# Patient Record
Sex: Female | Born: 1940 | Race: White | Hispanic: No | Marital: Single | Smoking: Former smoker
Health system: Southern US, Community
[De-identification: ages and names within clinical notes are randomized; demographics above are authoritative.]

## PROBLEM LIST (undated history)

## (undated) DIAGNOSIS — Z9889 Other specified postprocedural states: Secondary | ICD-10-CM

## (undated) DIAGNOSIS — D3A090 Benign carcinoid tumor of the bronchus and lung: Secondary | ICD-10-CM

## (undated) DIAGNOSIS — G4700 Insomnia, unspecified: Secondary | ICD-10-CM

## (undated) DIAGNOSIS — I10 Essential (primary) hypertension: Secondary | ICD-10-CM

## (undated) DIAGNOSIS — F32A Depression, unspecified: Secondary | ICD-10-CM

## (undated) DIAGNOSIS — R918 Other nonspecific abnormal finding of lung field: Secondary | ICD-10-CM

## (undated) DIAGNOSIS — M109 Gout, unspecified: Secondary | ICD-10-CM

## (undated) DIAGNOSIS — R112 Nausea with vomiting, unspecified: Secondary | ICD-10-CM

## (undated) DIAGNOSIS — J449 Chronic obstructive pulmonary disease, unspecified: Secondary | ICD-10-CM

## (undated) DIAGNOSIS — E1149 Type 2 diabetes mellitus with other diabetic neurological complication: Secondary | ICD-10-CM

## (undated) DIAGNOSIS — Z87442 Personal history of urinary calculi: Secondary | ICD-10-CM

## (undated) DIAGNOSIS — R0602 Shortness of breath: Secondary | ICD-10-CM

## (undated) DIAGNOSIS — F329 Major depressive disorder, single episode, unspecified: Secondary | ICD-10-CM

## (undated) DIAGNOSIS — E785 Hyperlipidemia, unspecified: Secondary | ICD-10-CM

## (undated) DIAGNOSIS — M1990 Unspecified osteoarthritis, unspecified site: Secondary | ICD-10-CM

## (undated) DIAGNOSIS — C801 Malignant (primary) neoplasm, unspecified: Secondary | ICD-10-CM

## (undated) DIAGNOSIS — R42 Dizziness and giddiness: Secondary | ICD-10-CM

## (undated) DIAGNOSIS — I499 Cardiac arrhythmia, unspecified: Secondary | ICD-10-CM

## (undated) DIAGNOSIS — E119 Type 2 diabetes mellitus without complications: Secondary | ICD-10-CM

## (undated) DIAGNOSIS — N189 Chronic kidney disease, unspecified: Secondary | ICD-10-CM

## (undated) HISTORY — PX: CATARACT EXTRACTION W/ INTRAOCULAR LENS  IMPLANT, BILATERAL: SHX1307

## (undated) HISTORY — DX: Essential (primary) hypertension: I10

## (undated) HISTORY — DX: Unspecified osteoarthritis, unspecified site: M19.90

## (undated) HISTORY — DX: Gout, unspecified: M10.9

## (undated) HISTORY — DX: Insomnia, unspecified: G47.00

## (undated) HISTORY — PX: EYE SURGERY: SHX253

## (undated) HISTORY — PX: LEG SURGERY: SHX1003

## (undated) HISTORY — PX: ABDOMINAL HYSTERECTOMY: SHX81

## (undated) HISTORY — DX: Other nonspecific abnormal finding of lung field: R91.8

## (undated) HISTORY — DX: Dizziness and giddiness: R42

## (undated) HISTORY — PX: CHOLECYSTECTOMY: SHX55

## (undated) HISTORY — DX: Malignant (primary) neoplasm, unspecified: C80.1

## (undated) HISTORY — PX: CARPAL TUNNEL RELEASE: SHX101

## (undated) HISTORY — DX: Type 2 diabetes mellitus without complications: E11.9

## (undated) HISTORY — DX: Type 2 diabetes mellitus with other diabetic neurological complication: E11.49

## (undated) HISTORY — DX: Major depressive disorder, single episode, unspecified: F32.9

## (undated) HISTORY — DX: Benign carcinoid tumor of the bronchus and lung: D3A.090

## (undated) HISTORY — DX: Shortness of breath: R06.02

## (undated) HISTORY — DX: Hyperlipidemia, unspecified: E78.5

## (undated) HISTORY — DX: Depression, unspecified: F32.A

---

## 2012-10-20 ENCOUNTER — Encounter: Payer: Self-pay | Admitting: *Deleted

## 2012-10-20 DIAGNOSIS — I10 Essential (primary) hypertension: Secondary | ICD-10-CM | POA: Insufficient documentation

## 2012-10-20 DIAGNOSIS — E785 Hyperlipidemia, unspecified: Secondary | ICD-10-CM | POA: Insufficient documentation

## 2012-10-20 DIAGNOSIS — E119 Type 2 diabetes mellitus without complications: Secondary | ICD-10-CM | POA: Insufficient documentation

## 2012-10-20 DIAGNOSIS — F329 Major depressive disorder, single episode, unspecified: Secondary | ICD-10-CM | POA: Insufficient documentation

## 2012-10-20 DIAGNOSIS — R0602 Shortness of breath: Secondary | ICD-10-CM | POA: Insufficient documentation

## 2012-10-20 DIAGNOSIS — R918 Other nonspecific abnormal finding of lung field: Secondary | ICD-10-CM | POA: Insufficient documentation

## 2012-10-20 DIAGNOSIS — G4700 Insomnia, unspecified: Secondary | ICD-10-CM | POA: Insufficient documentation

## 2012-10-20 DIAGNOSIS — R42 Dizziness and giddiness: Secondary | ICD-10-CM | POA: Insufficient documentation

## 2012-10-20 DIAGNOSIS — F32A Depression, unspecified: Secondary | ICD-10-CM | POA: Insufficient documentation

## 2012-10-20 DIAGNOSIS — E1149 Type 2 diabetes mellitus with other diabetic neurological complication: Secondary | ICD-10-CM | POA: Insufficient documentation

## 2012-10-20 DIAGNOSIS — M1990 Unspecified osteoarthritis, unspecified site: Secondary | ICD-10-CM | POA: Insufficient documentation

## 2012-10-20 DIAGNOSIS — M109 Gout, unspecified: Secondary | ICD-10-CM | POA: Insufficient documentation

## 2012-10-23 ENCOUNTER — Institutional Professional Consult (permissible substitution) (INDEPENDENT_AMBULATORY_CARE_PROVIDER_SITE_OTHER): Payer: Medicare Other | Admitting: Thoracic Surgery (Cardiothoracic Vascular Surgery)

## 2012-10-23 ENCOUNTER — Other Ambulatory Visit: Payer: Self-pay | Admitting: *Deleted

## 2012-10-23 ENCOUNTER — Encounter: Payer: Self-pay | Admitting: Thoracic Surgery (Cardiothoracic Vascular Surgery)

## 2012-10-23 VITALS — BP 150/86 | HR 96 | Resp 20 | Ht 64.0 in | Wt 185.0 lb

## 2012-10-23 DIAGNOSIS — R918 Other nonspecific abnormal finding of lung field: Secondary | ICD-10-CM

## 2012-10-23 DIAGNOSIS — R222 Localized swelling, mass and lump, trunk: Secondary | ICD-10-CM

## 2012-10-23 NOTE — Progress Notes (Signed)
PCP is Sherrill Raring, MD Referring Provider is Sherrill Raring, MD  Chief Complaint  Patient presents with  . Lung Mass    Surgical eval on lung lesion, Chest CT 10/17/12, ECHO 10/14/12    HPI: 72 year old woman presents with chief complaint of a lung nodule.  Mindy Martinez is a 72 year old woman with a history of tobacco abuse (60 pack years) and diabetes. She quit smoking in 2007.  She recently had a visit from a home health nurse who was sent by her insurance company. She says they do this once a year. The nurse noted a heart murmur. She was referred to Dr. Hanley Hays. Dr. Hanley Hays did an echocardiogram which showed normal left ventricular function and no significant valvular pathology. As part of her workup she also had a chest x-ray which showed a question of a right upper lobe nodule. A CT scan was done which showed that there was no right upper lobe nodule. However, there was a mass in her right middle lobes of suspicious for primary bronchogenic carcinoma. There was no hilar or mediastinal adenopathy. A stress test was also done which showed normal ejection fraction and no evidence of ischemia.  Mindy Martinez says that she can walk a block before getting short of breath. She also can walk up a flight of stairs. She's not had any chest pain or pressure or tightness. She's not had any appetite change, fatigue or weight loss. She smoked about a pack and a half a day from age 65 up until 2007 when she quit. She lives alone. She has 2 sons who do live in the area.  Zubrod score 1   Past Medical History  Diagnosis Date  . Hyperlipidemia     mixed  . Gout   . Hypertension     benign  . Arthritis     osteo  . Depression   . Diabetes mellitus without complication   . Type II or unspecified type diabetes mellitus with neurological manifestations, not stated as uncontrolled(250.60)     arthropathy  . Insomnia   . Vertigo   . Lung mass CT CHEST 10/17/2012    right middle lobe  . Lung  nodules CT CHEST 10/17/2012    BOTH LUNGS  . SOB (shortness of breath)     Past Surgical History  Procedure Laterality Date  . Cholecystectomy    . Abdominal hysterectomy      abdominal    Family History  Problem Relation Age of Onset  . Diabetes Mother   . Diabetes Brother   . Diabetes Maternal Grandmother     Social History History  Substance Use Topics  . Smoking status: Former Smoker -- 1.50 packs/day for 40 years    Types: Cigarettes    Quit date: 10/20/2005  . Smokeless tobacco: Not on file  . Alcohol Use: Not on file    Current Outpatient Prescriptions  Medication Sig Dispense Refill  . alendronate (FOSAMAX) 70 MG tablet Take 70 mg by mouth every 7 (seven) days.       Marland Kitchen amitriptyline (ELAVIL) 100 MG tablet Take 100 mg by mouth at bedtime.       Marland Kitchen aspirin 81 MG tablet Take 81 mg by mouth daily.      Marland Kitchen atorvastatin (LIPITOR) 40 MG tablet Take 40 mg by mouth daily.      . furosemide (LASIX) 40 MG tablet Take 60 mg by mouth. TAKES 1 AND 1/2 TABS DAILY      . insulin  NPH-regular (NOVOLIN 70/30) (70-30) 100 UNIT/ML injection Inject into the skin. AS DIRECTED      . linagliptin (TRADJENTA) 5 MG TABS tablet Take 5 mg by mouth daily.      . traMADol (ULTRAM) 50 MG tablet Take 50 mg by mouth every 8 (eight) hours as needed.       Marland Kitchen ULORIC 40 MG tablet Take 40 mg by mouth daily.        No current facility-administered medications for this visit.    Allergies  Allergen Reactions  . Antihistamines, Diphenhydramine-Type   . Bactrim [Sulfamethoxazole W-Trimethoprim] Other (See Comments)    Blurred vision  . Decongestant-Antihistamine [Triprolidine-Pse]   . Etodolac   . Macrobid [Nitrofurantoin Macrocrystal] Other (See Comments)    Does not remember  . Nabumetone   . Phenergan [Promethazine Hcl]   . Pyridium [Phenazopyridine Hcl]     Review of Systems  Constitutional: Negative for fever, chills, diaphoresis, activity change (activities limited due to foot injury one  year ago), appetite change, fatigue and unexpected weight change.  Eyes: Positive for visual disturbance.       Cataracts in both eyes  Respiratory: Positive for shortness of breath (Can walk one block on level ground or one flight of stairs). Negative for cough (No hemoptysis) and chest tightness.   Cardiovascular: Positive for leg swelling. Negative for chest pain and palpitations.  Endocrine:       Diabetes  Genitourinary:       Recurrent urinary tract infection  Musculoskeletal: Positive for joint swelling and arthralgias.  Neurological: Positive for dizziness and light-headedness. Negative for syncope and speech difficulty.  All other systems reviewed and are negative.    BP 150/86  Pulse 96  Resp 20  Ht 5\' 4"  (1.626 m)  Wt 185 lb (83.915 kg)  BMI 31.74 kg/m2  SpO2 96% Physical Exam  Vitals reviewed. Constitutional: She is oriented to person, place, and time. No distress.  Obese  HENT:  Head: Normocephalic and atraumatic.  Eyes: EOM are normal.  Neck: Neck supple. No tracheal deviation present. No thyromegaly present.  Cardiovascular: Normal rate, regular rhythm and normal heart sounds.  Exam reveals no gallop.   No murmur heard. Pulmonary/Chest: Effort normal and breath sounds normal. She has no wheezes. She has no rales.  Abdominal: Soft. There is no tenderness.  Musculoskeletal: She exhibits no edema.  Walks with a cane due to foot injury  Lymphadenopathy:    She has no cervical adenopathy.  Neurological: She is alert and oriented to person, place, and time. No cranial nerve deficit.  Skin: Skin is warm and dry.     Diagnostic Tests: CT [facility] chest [facility] 10/17/2012  Right middle lobe spiculated mass highly suspicious for pulmonary neoplasm. Question time metastatic nodules additionally both lungs. Recommend thoracic surgery consultation.  Impression: Mindy Martinez is a 72 year old woman with a history of tobacco abuse who has a new right  middle lobe mass that measures 1.7 x 1.6 x 1.2 cm. This is highly suspicious for a primary bronchogenic carcinoma, and needs to be considered that until it can be proven otherwise. The way to definitively rule out cancer in this setting would be with an excisional biopsy. Unfortunately a needle biopsy or bronchoscopic biopsy would be prone to a relatively high false-negative rate. Given that she is a reasonably good surgical candidate my recommendation is that we proceed with a right VATS and right middle lobectomy for definitive diagnosis and treatment of a lung mass. She  does need pulmonary function testing to confirm that she can tolerate a lobectomy however, I don't believe that there will be any issue with that given that this is just a right middle lobe lesion.  I discussed the CT findings with Mindy Martinez. We discussed the differential diagnosis, potential diagnostic procedures, and treatment. She understands my preference for proceeding with definitive excisional biopsy and treatment with a lobectomy rather than attempting bronchoscopic or needle biopsies. I discussed the proposed surgical procedure with her, including the use of general anesthesia, incisions to be used, expected hospital stay, and short and long term outcomes. She understands the indications, risks, benefits, and alternatives. She understands the risk include but are not limited to, death, MI, DVT, PE, bleeding, possible need for transfusion, infection, prolonged air leak, cardiac arrhythmias, respiratory failure, as well as other organ system dysfunction such as renal or gastrointestinal complications.  She understands and accepts the risk and wishes to proceed.  Plan: 1. PFTs with room air blood gas.  2. She wishes to speak with a case manager regarding potential assisted-living or other assistance postoperatively  3. Right VATS, right middle lobectomy on Wednesday, September 3.

## 2012-10-24 ENCOUNTER — Encounter (HOSPITAL_COMMUNITY): Payer: Self-pay | Admitting: Pharmacy Technician

## 2012-10-26 LAB — PULMONARY FUNCTION TEST

## 2012-10-27 ENCOUNTER — Ambulatory Visit (HOSPITAL_COMMUNITY)
Admission: RE | Admit: 2012-10-27 | Discharge: 2012-10-27 | Disposition: A | Discharge status: Home / Self Care | Payer: Medicare Other | Source: Ambulatory Visit | Attending: Thoracic Surgery (Cardiothoracic Vascular Surgery) | Admitting: Thoracic Surgery (Cardiothoracic Vascular Surgery)

## 2012-10-27 ENCOUNTER — Encounter (HOSPITAL_COMMUNITY): Payer: Medicare Other

## 2012-10-27 ENCOUNTER — Other Ambulatory Visit (HOSPITAL_COMMUNITY): Payer: Self-pay | Admitting: *Deleted

## 2012-10-27 DIAGNOSIS — R222 Localized swelling, mass and lump, trunk: Secondary | ICD-10-CM | POA: Insufficient documentation

## 2012-10-27 DIAGNOSIS — R918 Other nonspecific abnormal finding of lung field: Secondary | ICD-10-CM

## 2012-10-27 LAB — BLOOD GAS, ARTERIAL
Acid-base deficit: 1.1 mmol/L (ref 0.0–2.0)
Drawn by: 24485
Patient temperature: 98.6
pCO2 arterial: 42 mmHg (ref 35.0–45.0)
pH, Arterial: 7.366 (ref 7.350–7.450)

## 2012-10-27 NOTE — Pre-Procedure Instructions (Signed)
Mindy Martinez  10/27/2012   Your procedure is scheduled on:  November 01, 2012 at 10:00 AM  Report to [facility] at 7:30 AM.  Call this number if you have problems the morning of surgery: [phone]   Remember:   Do not eat food or drink liquids after midnight.   Take these medicines the morning of surgery with A SIP OF WATER: traMADol (ULTRAM), ULORIC     Do not wear jewelry, make-up or nail polish.  Do not wear lotions, powders, or perfumes. You may wear deodorant.  Do not shave 48 hours prior to surgery. Men may shave face and neck.  Do not bring valuables to the hospital.  [facility] is not responsible                   for any belongings or valuables.  Contacts, dentures or bridgework may not be worn into surgery.  Leave suitcase in the car. After surgery it may be brought to your room.  For patients admitted to the hospital, checkout time is 11:00 AM the day of  discharge.     Special Instructions: Shower using CHG 2 nights before surgery and the night before surgery.  If you shower the day of surgery use CHG.  Use special wash - you have one bottle of CHG for all showers.  You should use approximately 1/3 of the bottle for each shower.   Please read over the following fact sheets that you were given: Pain Booklet, Coughing and Deep Breathing, Blood Transfusion Information, MRSA Information and Surgical Site Infection Prevention

## 2012-10-31 ENCOUNTER — Encounter (HOSPITAL_COMMUNITY)
Admission: RE | Admit: 2012-10-31 | Discharge: 2012-10-31 | Disposition: A | Discharge status: Home / Self Care | Payer: Medicare Other | Source: Ambulatory Visit | Attending: Thoracic Surgery (Cardiothoracic Vascular Surgery) | Admitting: Thoracic Surgery (Cardiothoracic Vascular Surgery)

## 2012-10-31 ENCOUNTER — Encounter (HOSPITAL_COMMUNITY): Payer: Self-pay

## 2012-10-31 VITALS — BP 145/81 | HR 101 | Temp 98.8°F | Resp 18 | Ht 63.0 in | Wt 186.5 lb

## 2012-10-31 DIAGNOSIS — R918 Other nonspecific abnormal finding of lung field: Secondary | ICD-10-CM

## 2012-10-31 HISTORY — DX: Cardiac arrhythmia, unspecified: I49.9

## 2012-10-31 HISTORY — DX: Nausea with vomiting, unspecified: Z98.890

## 2012-10-31 HISTORY — DX: Chronic kidney disease, unspecified: N18.9

## 2012-10-31 HISTORY — DX: Chronic obstructive pulmonary disease, unspecified: J44.9

## 2012-10-31 HISTORY — DX: Nausea with vomiting, unspecified: R11.2

## 2012-10-31 HISTORY — DX: Personal history of urinary calculi: Z87.442

## 2012-10-31 LAB — URINALYSIS, ROUTINE W REFLEX MICROSCOPIC
Bilirubin Urine: NEGATIVE
Glucose, UA: 100 mg/dL — AB
Specific Gravity, Urine: 1.011 (ref 1.005–1.030)
pH: 6 (ref 5.0–8.0)

## 2012-10-31 LAB — CBC
HCT: 32.3 % — ABNORMAL LOW (ref 36.0–46.0)
Hemoglobin: 11 g/dL — ABNORMAL LOW (ref 12.0–15.0)
MCHC: 34.1 g/dL (ref 30.0–36.0)

## 2012-10-31 LAB — COMPREHENSIVE METABOLIC PANEL
Alkaline Phosphatase: 97 U/L (ref 39–117)
BUN: 45 mg/dL — ABNORMAL HIGH (ref 6–23)
GFR calc Af Amer: 16 mL/min — ABNORMAL LOW (ref >90)
GFR calc non Af Amer: 14 mL/min — ABNORMAL LOW (ref >90)
Glucose, Bld: 119 mg/dL — ABNORMAL HIGH (ref 70–99)
Potassium: 3.5 mEq/L (ref 3.5–5.1)
Total Bilirubin: 0.2 mg/dL — ABNORMAL LOW (ref 0.3–1.2)
Total Protein: 7.4 g/dL (ref 6.0–8.3)

## 2012-10-31 LAB — APTT: aPTT: 28 seconds (ref 24–37)

## 2012-10-31 LAB — PROTIME-INR: Prothrombin Time: 13.1 seconds (ref 11.6–15.2)

## 2012-10-31 LAB — ABO/RH: ABO/RH(D): O NEG

## 2012-10-31 LAB — URINE MICROSCOPIC-ADD ON

## 2012-10-31 LAB — SURGICAL PCR SCREEN: Staphylococcus aureus: NEGATIVE

## 2012-10-31 LAB — TYPE AND SCREEN: Antibody Screen: NEGATIVE

## 2012-10-31 MED ORDER — DEXTROSE 5 % IV SOLN
1.5000 g | INTRAVENOUS | Status: DC
  Filled 2012-10-31: qty 1.5

## 2012-10-31 MED ORDER — DEXTROSE 5 % IV SOLN
1.5000 g | INTRAVENOUS | Status: AC
  Administered 2012-11-01: 1.5 g via INTRAVENOUS
  Filled 2012-10-31: qty 1.5

## 2012-10-31 NOTE — Progress Notes (Addendum)
Anesthesia Chart Review:  Patient is a 72 year old female scheduled for right VATS, RM Lobectomy on 11/01/12 by Dr. Dorris Fetch.  History includes former smoker since 2007, DM2, HTN, COPD, HLD, depression, arthritis, nephrolithiasis, tachycardia (without mention of afib history).  She did not report a history of CKD, but Cr was 3.9 on 10/06/12.  PCP is listed as Dr. Hanley Hays at [facility], it was actually noted after 5 PM that Dr. Hanley Hays is actually a cardiologist that she saw at [facility].  She was also seen by pulmonologist Dr. Bethanie Dicker there as well.  PAT RN notes indicate that her PCP is actually Dr. Cindee Lame.    Echo on 10/14/12 [facility]) showed overall normal LV systolic function, EF 60-65%, normal LV size and thickness, mildly dilated LA, no intracardiac shuts, normal thoracic aorta and aortic arch, normal valve anatomy and function.  Nuclear stress test on 10/12/12 [facility]) showed normal myocardial perfusion, overall normal LV systolic function without regional wall motion abnormalities, LVEF 70%.   EKG on 10/31/12 showed NSR, non-specific ST abnormality.  PFTs on 10/26/12 [facility]) showed FVC 1.41 (52%), FEV1 1.00 (46%), DLCO 47%.  CXR on 10/31/12 showed: No acute chest process. Known 1.7 cm right middle lobe mass by CT is not well demonstrated by plain radiography.  Preoperative labs noted.  BUN/Cr 45/3.12, glucose 119, H/H 11.0/32.3, PT/PTT WNL.  Unfortunately, there were no comparison labs so these were requested from Dr. Dyke Brackett office and not received until 4:29 PM.  Her previous BUN/Cr on 10/06/12 were 54/3.9 which was called to Dr. Hanley Hays by that lab at that time.  Current and past BUN/Cr were called to Forestine Na, RN at [facility] who reviewed with Dr. Dorris Fetch who would like to proceed as planned since her renal function appears stable over the past 3 weeks.  I have updated anesthesiologist Dr. Jacklynn Bue.  Velna Ochs [facility]/Anesthesiology Phone  [phone] 10/31/2012 5:25 PM

## 2012-10-31 NOTE — Progress Notes (Signed)
Spoke with dee at office who stated patient could continue her 81 mg aspirin.  Also requested stress test, ekg, ov, holter monitor  From [facility].

## 2012-11-01 ENCOUNTER — Encounter (HOSPITAL_COMMUNITY)
Admission: RE | Disposition: A | Discharge status: Home / Self Care | Payer: Self-pay | Source: Ambulatory Visit | Attending: Thoracic Surgery (Cardiothoracic Vascular Surgery)

## 2012-11-01 ENCOUNTER — Inpatient Hospital Stay (HOSPITAL_COMMUNITY): Payer: Medicare Other | Admitting: Anesthesiology

## 2012-11-01 ENCOUNTER — Inpatient Hospital Stay (HOSPITAL_COMMUNITY): Payer: Medicare Other

## 2012-11-01 ENCOUNTER — Encounter (HOSPITAL_COMMUNITY): Payer: Self-pay | Admitting: Anesthesiology

## 2012-11-01 ENCOUNTER — Inpatient Hospital Stay (HOSPITAL_COMMUNITY)
Admission: RE | Admit: 2012-11-01 | Discharge: 2012-11-05 | DRG: 164 | Disposition: A | Discharge status: Home / Self Care | Payer: Medicare Other | Source: Ambulatory Visit | Attending: Thoracic Surgery (Cardiothoracic Vascular Surgery) | Admitting: Thoracic Surgery (Cardiothoracic Vascular Surgery)

## 2012-11-01 ENCOUNTER — Encounter (HOSPITAL_COMMUNITY): Payer: Self-pay | Admitting: Vascular Surgery

## 2012-11-01 DIAGNOSIS — E785 Hyperlipidemia, unspecified: Secondary | ICD-10-CM | POA: Diagnosis present

## 2012-11-01 DIAGNOSIS — R918 Other nonspecific abnormal finding of lung field: Secondary | ICD-10-CM

## 2012-11-01 DIAGNOSIS — C342 Malignant neoplasm of middle lobe, bronchus or lung: Principal | ICD-10-CM | POA: Diagnosis present

## 2012-11-01 DIAGNOSIS — Z7982 Long term (current) use of aspirin: Secondary | ICD-10-CM

## 2012-11-01 DIAGNOSIS — R0602 Shortness of breath: Secondary | ICD-10-CM | POA: Diagnosis present

## 2012-11-01 DIAGNOSIS — D381 Neoplasm of uncertain behavior of trachea, bronchus and lung: Secondary | ICD-10-CM

## 2012-11-01 DIAGNOSIS — R0989 Other specified symptoms and signs involving the circulatory and respiratory systems: Secondary | ICD-10-CM | POA: Diagnosis not present

## 2012-11-01 DIAGNOSIS — J9819 Other pulmonary collapse: Secondary | ICD-10-CM | POA: Diagnosis not present

## 2012-11-01 DIAGNOSIS — F3289 Other specified depressive episodes: Secondary | ICD-10-CM | POA: Diagnosis present

## 2012-11-01 DIAGNOSIS — N185 Chronic kidney disease, stage 5: Secondary | ICD-10-CM | POA: Diagnosis present

## 2012-11-01 DIAGNOSIS — R0609 Other forms of dyspnea: Secondary | ICD-10-CM | POA: Diagnosis not present

## 2012-11-01 DIAGNOSIS — F329 Major depressive disorder, single episode, unspecified: Secondary | ICD-10-CM | POA: Diagnosis present

## 2012-11-01 DIAGNOSIS — E1149 Type 2 diabetes mellitus with other diabetic neurological complication: Secondary | ICD-10-CM | POA: Diagnosis present

## 2012-11-01 DIAGNOSIS — R222 Localized swelling, mass and lump, trunk: Secondary | ICD-10-CM | POA: Diagnosis present

## 2012-11-01 DIAGNOSIS — I120 Hypertensive chronic kidney disease with stage 5 chronic kidney disease or end stage renal disease: Secondary | ICD-10-CM | POA: Diagnosis present

## 2012-11-01 DIAGNOSIS — Z87891 Personal history of nicotine dependence: Secondary | ICD-10-CM

## 2012-11-01 HISTORY — PX: VIDEO ASSISTED THORACOSCOPY (VATS)/ LOBECTOMY: SHX6169

## 2012-11-01 LAB — GLUCOSE, CAPILLARY
Glucose-Capillary: 126 mg/dL — ABNORMAL HIGH (ref 70–99)
Glucose-Capillary: 168 mg/dL — ABNORMAL HIGH (ref 70–99)
Glucose-Capillary: 189 mg/dL — ABNORMAL HIGH (ref 70–99)

## 2012-11-01 LAB — URINE CULTURE

## 2012-11-01 SURGERY — VIDEO ASSISTED THORACOSCOPY (VATS)/ LOBECTOMY
Anesthesia: General | Site: Chest | Laterality: Right | Wound class: Clean Contaminated

## 2012-11-01 MED ORDER — PHENYLEPHRINE HCL 10 MG/ML IJ SOLN
INTRAMUSCULAR | Status: DC | PRN
  Administered 2012-11-01 (×2): 40 ug via INTRAVENOUS

## 2012-11-01 MED ORDER — HYDROMORPHONE HCL PF 1 MG/ML IJ SOLN
INTRAMUSCULAR | Status: AC
  Filled 2012-11-01: qty 1

## 2012-11-01 MED ORDER — FENTANYL 10 MCG/ML IV SOLN
INTRAVENOUS | Status: DC
  Administered 2012-11-01: 10 ug via INTRAVENOUS
  Administered 2012-11-01: 16:00:00 via INTRAVENOUS
  Administered 2012-11-02: 30 ug via INTRAVENOUS
  Administered 2012-11-02: 40 ug via INTRAVENOUS
  Administered 2012-11-02: 60 ug via INTRAVENOUS
  Administered 2012-11-02: 16:00:00 via INTRAVENOUS
  Administered 2012-11-02: 50 ug via INTRAVENOUS
  Administered 2012-11-02 (×2): 30 ug via INTRAVENOUS
  Administered 2012-11-03: 60 ug via INTRAVENOUS
  Administered 2012-11-03: 20 ug via INTRAVENOUS
  Administered 2012-11-03: 40 ug via INTRAVENOUS
  Administered 2012-11-03: 20 ug via INTRAVENOUS
  Administered 2012-11-03: 60 ug via INTRAVENOUS
  Administered 2012-11-03: 40 ug via INTRAVENOUS
  Administered 2012-11-04: 30 ug via INTRAVENOUS
  Administered 2012-11-04: 50 ug via INTRAVENOUS
  Filled 2012-11-01 (×2): qty 50

## 2012-11-01 MED ORDER — HYDROMORPHONE HCL PF 1 MG/ML IJ SOLN
0.2500 mg | INTRAMUSCULAR | Status: DC | PRN
  Administered 2012-11-01 (×4): 0.5 mg via INTRAVENOUS

## 2012-11-01 MED ORDER — ONDANSETRON HCL 4 MG/2ML IJ SOLN
INTRAMUSCULAR | Status: DC | PRN
  Administered 2012-11-01: 4 mg via INTRAVENOUS

## 2012-11-01 MED ORDER — SENNOSIDES-DOCUSATE SODIUM 8.6-50 MG PO TABS
1.0000 | ORAL_TABLET | Freq: Every evening | ORAL | Status: DC | PRN
  Filled 2012-11-01: qty 1

## 2012-11-01 MED ORDER — ATORVASTATIN CALCIUM 40 MG PO TABS
40.0000 mg | ORAL_TABLET | Freq: Every day | ORAL | Status: DC
  Administered 2012-11-02 – 2012-11-04 (×3): 40 mg via ORAL
  Filled 2012-11-01 (×5): qty 1

## 2012-11-01 MED ORDER — SODIUM CHLORIDE 0.9 % IJ SOLN: 9.0000 mL | INTRAMUSCULAR | Status: DC | PRN

## 2012-11-01 MED ORDER — OXYCODONE HCL 5 MG PO TABS: 5.0000 mg | ORAL_TABLET | ORAL | Status: AC | PRN

## 2012-11-01 MED ORDER — BISACODYL 5 MG PO TBEC
10.0000 mg | DELAYED_RELEASE_TABLET | Freq: Every day | ORAL | Status: DC
  Administered 2012-11-02 – 2012-11-05 (×4): 10 mg via ORAL
  Filled 2012-11-01 (×4): qty 2

## 2012-11-01 MED ORDER — FENTANYL CITRATE 0.05 MG/ML IJ SOLN
INTRAMUSCULAR | Status: DC | PRN
  Administered 2012-11-01 (×3): 50 ug via INTRAVENOUS
  Administered 2012-11-01: 100 ug via INTRAVENOUS

## 2012-11-01 MED ORDER — FEBUXOSTAT 40 MG PO TABS
40.0000 mg | ORAL_TABLET | Freq: Every day | ORAL | Status: DC
  Administered 2012-11-02 – 2012-11-05 (×4): 40 mg via ORAL
  Filled 2012-11-01 (×5): qty 1

## 2012-11-01 MED ORDER — LATANOPROST 0.005 % OP SOLN
1.0000 [drp] | Freq: Every day | OPHTHALMIC | Status: DC
  Administered 2012-11-01 – 2012-11-04 (×3): 1 [drp] via OPHTHALMIC
  Filled 2012-11-01 (×2): qty 2.5

## 2012-11-01 MED ORDER — ALBUMIN HUMAN 5 % IV SOLN
INTRAVENOUS | Status: DC | PRN
  Administered 2012-11-01: 13:00:00 via INTRAVENOUS

## 2012-11-01 MED ORDER — ACETAMINOPHEN 500 MG PO TABS
1000.0000 mg | ORAL_TABLET | Freq: Four times a day (QID) | ORAL | Status: AC
  Filled 2012-11-01: qty 2

## 2012-11-01 MED ORDER — NALOXONE HCL 0.4 MG/ML IJ SOLN: 0.4000 mg | INTRAMUSCULAR | Status: DC | PRN

## 2012-11-01 MED ORDER — NEOSTIGMINE METHYLSULFATE 1 MG/ML IJ SOLN
INTRAMUSCULAR | Status: DC | PRN
  Administered 2012-11-01: 4 mg via INTRAVENOUS

## 2012-11-01 MED ORDER — DIPHENHYDRAMINE HCL 12.5 MG/5ML PO ELIX
12.5000 mg | ORAL_SOLUTION | Freq: Four times a day (QID) | ORAL | Status: DC | PRN
  Filled 2012-11-01: qty 5

## 2012-11-01 MED ORDER — ALBUTEROL SULFATE (5 MG/ML) 0.5% IN NEBU
2.5000 mg | INHALATION_SOLUTION | Freq: Four times a day (QID) | RESPIRATORY_TRACT | Status: DC
  Administered 2012-11-02 – 2012-11-04 (×10): 2.5 mg via RESPIRATORY_TRACT
  Filled 2012-11-01 (×11): qty 0.5

## 2012-11-01 MED ORDER — ACETAMINOPHEN 160 MG/5ML PO SOLN
1000.0000 mg | Freq: Four times a day (QID) | ORAL | Status: AC
  Administered 2012-11-02 (×3): 1000 mg via ORAL
  Filled 2012-11-01 (×3): qty 40.6

## 2012-11-01 MED ORDER — ALBUTEROL SULFATE HFA 108 (90 BASE) MCG/ACT IN AERS
INHALATION_SPRAY | RESPIRATORY_TRACT | Status: DC | PRN
  Administered 2012-11-01 (×2): 2 via RESPIRATORY_TRACT

## 2012-11-01 MED ORDER — BUDESONIDE-FORMOTEROL FUMARATE 160-4.5 MCG/ACT IN AERO
2.0000 | INHALATION_SPRAY | Freq: Two times a day (BID) | RESPIRATORY_TRACT | Status: DC
  Administered 2012-11-02 – 2012-11-05 (×7): 2 via RESPIRATORY_TRACT
  Filled 2012-11-01: qty 6

## 2012-11-01 MED ORDER — OXYCODONE HCL 5 MG PO TABS
5.0000 mg | ORAL_TABLET | Freq: Once | ORAL | Status: DC | PRN
Start: 2012-11-01 — End: 2012-11-01

## 2012-11-01 MED ORDER — ONDANSETRON HCL 4 MG/2ML IJ SOLN: 4.0000 mg | Freq: Four times a day (QID) | INTRAMUSCULAR | Status: DC | PRN

## 2012-11-01 MED ORDER — LACTATED RINGERS IV SOLN
INTRAVENOUS | Status: DC | PRN
  Administered 2012-11-01 (×2): via INTRAVENOUS

## 2012-11-01 MED ORDER — DIPHENHYDRAMINE HCL 50 MG/ML IJ SOLN: 12.5000 mg | Freq: Four times a day (QID) | INTRAMUSCULAR | Status: DC | PRN

## 2012-11-01 MED ORDER — OXYCODONE HCL 5 MG/5ML PO SOLN: 5.0000 mg | Freq: Once | ORAL | Status: DC | PRN

## 2012-11-01 MED ORDER — INSULIN ASPART 100 UNIT/ML ~~LOC~~ SOLN
0.0000 [IU] | SUBCUTANEOUS | Status: DC
  Administered 2012-11-01 – 2012-11-02 (×2): 4 [IU] via SUBCUTANEOUS
  Administered 2012-11-02: 2 [IU] via SUBCUTANEOUS

## 2012-11-01 MED ORDER — MIDAZOLAM HCL 2 MG/2ML IJ SOLN
INTRAMUSCULAR | Status: AC
  Administered 2012-11-01: 1 mg via INTRAVENOUS
  Filled 2012-11-01: qty 2

## 2012-11-01 MED ORDER — LABETALOL HCL 5 MG/ML IV SOLN
INTRAVENOUS | Status: DC | PRN
  Administered 2012-11-01: 5 mg via INTRAVENOUS

## 2012-11-01 MED ORDER — TRAMADOL HCL 50 MG PO TABS
50.0000 mg | ORAL_TABLET | Freq: Four times a day (QID) | ORAL | Status: DC | PRN
  Administered 2012-11-04: 100 mg via ORAL
  Filled 2012-11-01: qty 2

## 2012-11-01 MED ORDER — PROPOFOL 10 MG/ML IV BOLUS
INTRAVENOUS | Status: DC | PRN
  Administered 2012-11-01: 200 mg via INTRAVENOUS

## 2012-11-01 MED ORDER — LACTATED RINGERS IV SOLN
INTRAVENOUS | Status: DC
  Administered 2012-11-01: 09:00:00 via INTRAVENOUS

## 2012-11-01 MED ORDER — OXYCODONE-ACETAMINOPHEN 5-325 MG PO TABS: 1.0000 | ORAL_TABLET | ORAL | Status: DC | PRN

## 2012-11-01 MED ORDER — 0.9 % SODIUM CHLORIDE (POUR BTL) OPTIME
TOPICAL | Status: DC | PRN
  Administered 2012-11-01: 2000 mL

## 2012-11-01 MED ORDER — ASPIRIN 81 MG PO CHEW
81.0000 mg | CHEWABLE_TABLET | Freq: Every day | ORAL | Status: DC
  Administered 2012-11-02 – 2012-11-05 (×4): 81 mg via ORAL
  Filled 2012-11-01 (×4): qty 1

## 2012-11-01 MED ORDER — ALBUTEROL SULFATE (5 MG/ML) 0.5% IN NEBU
2.5000 mg | INHALATION_SOLUTION | RESPIRATORY_TRACT | Status: DC
  Administered 2012-11-01: 2.5 mg via RESPIRATORY_TRACT
  Filled 2012-11-01: qty 0.5

## 2012-11-01 MED ORDER — OXYCODONE-ACETAMINOPHEN 5-325 MG PO TABS
1.0000 | ORAL_TABLET | ORAL | Status: DC | PRN
  Administered 2012-11-02 – 2012-11-03 (×2): 2 via ORAL
  Filled 2012-11-01 (×2): qty 2

## 2012-11-01 MED ORDER — DEXTROSE-NACL 5-0.9 % IV SOLN
INTRAVENOUS | Status: DC
  Administered 2012-11-01: 125 mL/h via INTRAVENOUS
  Administered 2012-11-03: 20 mL/h via INTRAVENOUS

## 2012-11-01 MED ORDER — POTASSIUM CHLORIDE 10 MEQ/50ML IV SOLN: 10.0000 meq | Freq: Every day | INTRAVENOUS | Status: DC | PRN

## 2012-11-01 MED ORDER — FENTANYL CITRATE 0.05 MG/ML IJ SOLN
INTRAMUSCULAR | Status: AC
  Administered 2012-11-01: 50 ug via INTRAVENOUS
  Filled 2012-11-01: qty 2

## 2012-11-01 MED ORDER — FENTANYL CITRATE 0.05 MG/ML IJ SOLN
50.0000 ug | INTRAMUSCULAR | Status: DC | PRN
  Administered 2012-11-01: 50 ug via INTRAVENOUS

## 2012-11-01 MED ORDER — ALBUTEROL SULFATE (5 MG/ML) 0.5% IN NEBU: 2.5000 mg | INHALATION_SOLUTION | RESPIRATORY_TRACT | Status: DC | PRN

## 2012-11-01 MED ORDER — HEMOSTATIC AGENTS (NO CHARGE) OPTIME
TOPICAL | Status: DC | PRN
  Administered 2012-11-01: 1 via TOPICAL

## 2012-11-01 MED ORDER — DEXTROSE 5 % IV SOLN
1.5000 g | Freq: Two times a day (BID) | INTRAVENOUS | Status: AC
  Administered 2012-11-01 – 2012-11-02 (×2): 1.5 g via INTRAVENOUS
  Filled 2012-11-01 (×2): qty 1.5

## 2012-11-01 MED ORDER — MIDAZOLAM HCL 2 MG/2ML IJ SOLN
1.0000 mg | INTRAMUSCULAR | Status: DC | PRN
  Administered 2012-11-01: 1 mg via INTRAVENOUS

## 2012-11-01 MED ORDER — GLYCOPYRROLATE 0.2 MG/ML IJ SOLN
INTRAMUSCULAR | Status: DC | PRN
  Administered 2012-11-01: 0.6 mg via INTRAVENOUS

## 2012-11-01 MED ORDER — VECURONIUM BROMIDE 10 MG IV SOLR
INTRAVENOUS | Status: DC | PRN
  Administered 2012-11-01: 6 mg via INTRAVENOUS
  Administered 2012-11-01: 2 mg via INTRAVENOUS
  Administered 2012-11-01 (×2): 1 mg via INTRAVENOUS

## 2012-11-01 SURGICAL SUPPLY — 73 items
APPLIER CLIP 5 13 M/L LIGAMAX5 (MISCELLANEOUS) ×2
APPLIER CLIP ROT 10 11.4 M/L (STAPLE)
CANISTER SUCTION 2500CC (MISCELLANEOUS) ×6 IMPLANT
CATH KIT ON Q 5IN SLV (PAIN MANAGEMENT) IMPLANT
CATH THORACIC 28FR (CATHETERS) IMPLANT
CATH THORACIC 28FR RT ANG (CATHETERS) IMPLANT
CATH THORACIC 36FR (CATHETERS) IMPLANT
CATH THORACIC 36FR RT ANG (CATHETERS) IMPLANT
CLIP APPLIE 5 13 M/L LIGAMAX5 (MISCELLANEOUS) ×1 IMPLANT
CLIP APPLIE ROT 10 11.4 M/L (STAPLE) IMPLANT
CLIP TI MEDIUM 6 (CLIP) ×2 IMPLANT
CLOTH BEACON ORANGE TIMEOUT ST (SAFETY) ×2 IMPLANT
CONN ST 1/4X3/8  BEN (MISCELLANEOUS) ×3
CONN ST 1/4X3/8 BEN (MISCELLANEOUS) ×3 IMPLANT
CONN Y 3/8X3/8X3/8  BEN (MISCELLANEOUS) ×1
CONN Y 3/8X3/8X3/8 BEN (MISCELLANEOUS) ×1 IMPLANT
CONT SPEC 4OZ CLIKSEAL STRL BL (MISCELLANEOUS) ×4 IMPLANT
DRAPE LAPAROSCOPIC ABDOMINAL (DRAPES) ×2 IMPLANT
DRAPE WARM FLUID 44X44 (DRAPE) ×2 IMPLANT
ELECT REM PT RETURN 9FT ADLT (ELECTROSURGICAL) ×2
ELECTRODE REM PT RTRN 9FT ADLT (ELECTROSURGICAL) ×1 IMPLANT
GLOVE BIO SURGEON STRL SZ 6.5 (GLOVE) ×2 IMPLANT
GLOVE BIO SURGEON STRL SZ7.5 (GLOVE) ×2 IMPLANT
GLOVE SURG SIGNA 7.5 PF LTX (GLOVE) ×4 IMPLANT
GOWN PREVENTION PLUS XLARGE (GOWN DISPOSABLE) ×4 IMPLANT
GOWN STRL NON-REIN LRG LVL3 (GOWN DISPOSABLE) ×4 IMPLANT
HANDLE STAPLE ENDO GIA SHORT (STAPLE) ×1
HEMOSTAT SURGICEL 2X14 (HEMOSTASIS) ×2 IMPLANT
KIT BASIN OR (CUSTOM PROCEDURE TRAY) ×2 IMPLANT
KIT ROOM TURNOVER OR (KITS) ×2 IMPLANT
KIT SUCTION CATH 14FR (SUCTIONS) ×2 IMPLANT
NS IRRIG 1000ML POUR BTL (IV SOLUTION) ×4 IMPLANT
PACK CHEST (CUSTOM PROCEDURE TRAY) ×2 IMPLANT
PAD ARMBOARD 7.5X6 YLW CONV (MISCELLANEOUS) ×4 IMPLANT
POUCH ENDO CATCH II 15MM (MISCELLANEOUS) IMPLANT
POUCH SPECIMEN RETRIEVAL 10MM (ENDOMECHANICALS) ×2 IMPLANT
RELOAD EGIA 45 TAN VASC (STAPLE) ×4 IMPLANT
RELOAD EGIA 60 MED/THCK PURPLE (STAPLE) ×4 IMPLANT
RELOAD EGIA TRIS TAN 45 CVD (STAPLE) ×4 IMPLANT
RELOAD ENDO GIA 30 3.5 (STAPLE) ×2 IMPLANT
SEALANT PROGEL (MISCELLANEOUS) IMPLANT
SEALANT SURG COSEAL 4ML (VASCULAR PRODUCTS) IMPLANT
SEALANT SURG COSEAL 8ML (VASCULAR PRODUCTS) IMPLANT
SOLUTION ANTI FOG 6CC (MISCELLANEOUS) ×2 IMPLANT
SPECIMEN JAR MEDIUM (MISCELLANEOUS) ×2 IMPLANT
SPONGE GAUZE 4X4 12PLY (GAUZE/BANDAGES/DRESSINGS) ×2 IMPLANT
SPONGE INTESTINAL PEANUT (DISPOSABLE) IMPLANT
STAPLER ENDO GIA 12MM SHORT (STAPLE) ×1 IMPLANT
SUT PROLENE 4 0 RB 1 (SUTURE) ×1
SUT PROLENE 4-0 RB1 .5 CRCL 36 (SUTURE) ×1 IMPLANT
SUT SILK  1 MH (SUTURE) ×1
SUT SILK 1 MH (SUTURE) ×1 IMPLANT
SUT SILK 2 0SH CR/8 30 (SUTURE) ×2 IMPLANT
SUT SILK 3 0SH CR/8 30 (SUTURE) IMPLANT
SUT VIC AB 1 CTX 36 (SUTURE) ×1
SUT VIC AB 1 CTX36XBRD ANBCTR (SUTURE) ×1 IMPLANT
SUT VIC AB 2-0 CTX 36 (SUTURE) ×4 IMPLANT
SUT VIC AB 2-0 UR6 27 (SUTURE) IMPLANT
SUT VIC AB 3-0 MH 27 (SUTURE) IMPLANT
SUT VIC AB 3-0 X1 27 (SUTURE) ×2 IMPLANT
SUT VICRYL 2 TP 1 (SUTURE) ×2 IMPLANT
SWAB COLLECTION DEVICE MRSA (MISCELLANEOUS) IMPLANT
SYSTEM SAHARA CHEST DRAIN ATS (WOUND CARE) ×2 IMPLANT
TAPE CLOTH 4X10 WHT NS (GAUZE/BANDAGES/DRESSINGS) ×2 IMPLANT
TAPE CLOTH SURG 4X10 WHT LF (GAUZE/BANDAGES/DRESSINGS) ×2 IMPLANT
TIP APPLICATOR SPRAY EXTEND 16 (VASCULAR PRODUCTS) ×2 IMPLANT
TOWEL OR 17X24 6PK STRL BLUE (TOWEL DISPOSABLE) ×2 IMPLANT
TOWEL OR 17X26 10 PK STRL BLUE (TOWEL DISPOSABLE) ×2 IMPLANT
TRAP SPECIMEN MUCOUS 40CC (MISCELLANEOUS) IMPLANT
TRAY FOLEY CATH 14FRSI W/METER (CATHETERS) ×2 IMPLANT
TUBE ANAEROBIC SPECIMEN COL (MISCELLANEOUS) IMPLANT
TUNNELER SHEATH ON-Q 11GX8 DSP (PAIN MANAGEMENT) IMPLANT
WATER STERILE IRR 1000ML POUR (IV SOLUTION) ×4 IMPLANT

## 2012-11-01 NOTE — Progress Notes (Signed)
CT surgery  Resting comfortably after right VATS Minimal chest tube drainage, no airleak Lungs clear

## 2012-11-01 NOTE — Interval H&P Note (Signed)
History and Physical Interval Note:  FEV1= 1.12 after bronchodilators- no issue for RML  11/01/2012 10:34 AM  Mindy Martinez  has presented today for surgery, with the diagnosis of RML MASS  The various methods of treatment have been discussed with the patient and family. After consideration of risks, benefits and other options for treatment, the patient has consented to  Procedure(s): VIDEO ASSISTED THORACOSCOPY (VATS)/ LOBECTOMY (Right) as a surgical intervention .  The patient's history has been reviewed, patient examined, no change in status, stable for surgery.  I have reviewed the patient's chart and labs.  Questions were answered to the patient's satisfaction.     HENDRICKSON,STEVEN C

## 2012-11-01 NOTE — Brief Op Note (Addendum)
      [address]       [name] [address]             [phone]     11/01/2012  2:13 PM  PATIENT:  Mindy Martinez  72 y.o. female  PRE-OPERATIVE DIAGNOSIS:  Right Middle Lobe Lung Mass  POST-OPERATIVE DIAGNOSIS:  Right Middle Lobe Lung Mass  PROCEDURE:  Procedure(s): VIDEO ASSISTED THORACOSCOPY (VATS)/ LOBECTOMY(RML), Mediastinal Lymph Node Dissection  SURGEON:  Surgeon(s): Loreli Slot, MD  PHYSICIAN ASSISTANT: WAYNE GOLD PA-C  ANESTHESIA:   general  SPECIMEN:  Source of Specimen:  RML, LN'S  DISPOSITION OF SPECIMEN:  Pathology  DRAINS: 2 Chest Tube(s) in the RIGHT HEMITHORAX   PATIENT CONDITION:  PACU - hemodynamically stable.  PRE-OPERATIVE WEIGHT: 84kg  FROZEN: NON-SMALL CELL CA, margins clear  COMPLICATIONS: NO KNOWN

## 2012-11-01 NOTE — Anesthesia Preprocedure Evaluation (Addendum)
Anesthesia Evaluation  Patient identified by MRN, date of birth, ID band Patient awake    Reviewed: Allergy & Precautions, H&P , NPO status , Patient's Chart, lab work & pertinent test results  Airway Mallampati: II  Neck ROM: full    Dental  (+) Edentulous Upper and Edentulous Lower   Pulmonary shortness of breath, COPDformer smoker,  10-31-12 Chest x-ray Findings: Normal heart size and vascularity.  Chronic bronchitic changes centrally.  The apparent 1.7 cm right middle lobe mass is not well demonstrated by plain radiography.  This is present on outside chest CT which is not available for direct comparison.  No superimposed pneumonia, collapse or consolidation.  No effusion or pneumothorax.  Trachea midline.   IMPRESSION: No acute chest process.  Known 1.7 cm right middle lobe mass by CT is not well demonstrated by plain radiography.     Pulmonary exam normal       Cardiovascular hypertension, Pt. on medications Rhythm:Regular Rate:Normal  31-Oct-2012 10:44:47 [facility] ROUTINE RECORD Normal sinus rhythm Nonspecific ST abnormality Borderline criteria for First degree heart block No old tracing to compare   Neuro/Psych Depression    GI/Hepatic negative GI ROS, Neg liver ROS,   Endo/Other  diabetes, Type 2  Renal/GU Renal InsufficiencyRenal disease     Musculoskeletal  (+) Arthritis -,   Abdominal Normal abdominal exam  (+)   Peds  Hematology   Anesthesia Other Findings   Reproductive/Obstetrics                       Anesthesia Physical Anesthesia Plan  ASA: III  Anesthesia Plan: General   Post-op Pain Management:    Induction: Intravenous  Airway Management Planned: Double Lumen EBT  Additional Equipment: Arterial line and CVP  Intra-op Plan:   Post-operative Plan: Extubation in OR  Informed Consent: I have reviewed the patients History and Physical,  chart, labs and discussed the procedure including the risks, benefits and alternatives for the proposed anesthesia with the patient or authorized representative who has indicated his/her understanding and acceptance.     Plan Discussed with: CRNA, Anesthesiologist and Surgeon  Anesthesia Plan Comments:         Anesthesia Quick Evaluation

## 2012-11-01 NOTE — Transfer of Care (Signed)
Immediate Anesthesia Transfer of Care Note  Patient: Mindy Martinez  Procedure(s) Performed: Procedure(s): VIDEO ASSISTED THORACOSCOPY (VATS)/ LOBECTOMY (Right)  Patient Location: PACU  Anesthesia Type:General  Level of Consciousness: responds to stimulation  Airway & Oxygen Therapy: Patient Spontanous Breathing and Patient connected to nasal cannula oxygen  Post-op Assessment: Report given to PACU RN and Post -op Vital signs reviewed and stable  Post vital signs: Reviewed and stable  Complications: No apparent anesthesia complications

## 2012-11-01 NOTE — OR Nursing (Signed)
12:00 - Unable to get CT images up on computer screen - documented per Dr. Dorris Fetch

## 2012-11-01 NOTE — Anesthesia Postprocedure Evaluation (Signed)
Anesthesia Post Note  Patient: Mindy Martinez  Procedure(s) Performed: Procedure(s) (LRB): VIDEO ASSISTED THORACOSCOPY (VATS)/ LOBECTOMY (Right)  Anesthesia type: General  Patient location: PACU  Post pain: Pain level controlled and Adequate analgesia  Post assessment: Post-op Vital signs reviewed, Patient's Cardiovascular Status Stable, Respiratory Function Stable, Patent Airway and Pain level controlled  Last Vitals:  Filed Vitals:   11/01/12 1435  BP: 144/64  Pulse: 71  Temp:   Resp: 29    Post vital signs: Reviewed and stable  Level of consciousness: awake, alert  and oriented  Complications: No apparent anesthesia complications

## 2012-11-01 NOTE — Preoperative (Signed)
Beta Blockers   Reason not to administer Beta Blockers:Not Applicable 

## 2012-11-01 NOTE — H&P (View-Only) (Signed)
PCP is Rosario, Raymond T, MD Referring Provider is Rosario, Raymond T, MD  Chief Complaint  Patient presents with  . Lung Mass    Surgical eval on lung lesion, Chest CT 10/17/12, ECHO 10/14/12    HPI: 72-year-old woman presents with chief complaint of a lung nodule.  Mindy Martinez is a 72-year-old woman with a history of tobacco abuse (60 pack years) and diabetes. She quit smoking in 2007.  She recently had a visit from a home health nurse who was sent by her insurance company. She says they do this once a year. The nurse noted a heart murmur. She was referred to Dr. Rosario. Dr. Rosario did an echocardiogram which showed normal left ventricular function and no significant valvular pathology. As part of her workup she also had a chest x-ray which showed a question of a right upper lobe nodule. A CT scan was done which showed that there was no right upper lobe nodule. However, there was a mass in her right middle lobes of suspicious for primary bronchogenic carcinoma. There was no hilar or mediastinal adenopathy. A stress test was also done which showed normal ejection fraction and no evidence of ischemia.  Mindy Martinez says that she can walk a block before getting short of breath. She also can walk up a flight of stairs. She's not had any chest pain or pressure or tightness. She's not had any appetite change, fatigue or weight loss. She smoked about a pack and a half a day from age 21 up until 2007 when she quit. She lives alone. She has 2 sons who do live in the area.  Zubrod score 1   Past Medical History  Diagnosis Date  . Hyperlipidemia     mixed  . Gout   . Hypertension     benign  . Arthritis     osteo  . Depression   . Diabetes mellitus without complication   . Type II or unspecified type diabetes mellitus with neurological manifestations, not stated as uncontrolled(250.60)     arthropathy  . Insomnia   . Vertigo   . Lung mass CT CHEST 10/17/2012    right middle lobe  . Lung  nodules CT CHEST 10/17/2012    BOTH LUNGS  . SOB (shortness of breath)     Past Surgical History  Procedure Laterality Date  . Cholecystectomy    . Abdominal hysterectomy      abdominal    Family History  Problem Relation Age of Onset  . Diabetes Mother   . Diabetes Brother   . Diabetes Maternal Grandmother     Social History History  Substance Use Topics  . Smoking status: Former Smoker -- 1.50 packs/day for 40 years    Types: Cigarettes    Quit date: 10/20/2005  . Smokeless tobacco: Not on file  . Alcohol Use: Not on file    Current Outpatient Prescriptions  Medication Sig Dispense Refill  . alendronate (FOSAMAX) 70 MG tablet Take 70 mg by mouth every 7 (seven) days.       . amitriptyline (ELAVIL) 100 MG tablet Take 100 mg by mouth at bedtime.       . aspirin 81 MG tablet Take 81 mg by mouth daily.      . atorvastatin (LIPITOR) 40 MG tablet Take 40 mg by mouth daily.      . furosemide (LASIX) 40 MG tablet Take 60 mg by mouth. TAKES 1 AND 1/2 TABS DAILY      . insulin   NPH-regular (NOVOLIN 70/30) (70-30) 100 UNIT/ML injection Inject into the skin. AS DIRECTED      . linagliptin (TRADJENTA) 5 MG TABS tablet Take 5 mg by mouth daily.      . traMADol (ULTRAM) 50 MG tablet Take 50 mg by mouth every 8 (eight) hours as needed.       . ULORIC 40 MG tablet Take 40 mg by mouth daily.        No current facility-administered medications for this visit.    Allergies  Allergen Reactions  . Antihistamines, Diphenhydramine-Type   . Bactrim [Sulfamethoxazole W-Trimethoprim] Other (See Comments)    Blurred vision  . Decongestant-Antihistamine [Triprolidine-Pse]   . Etodolac   . Macrobid [Nitrofurantoin Macrocrystal] Other (See Comments)    Does not remember  . Nabumetone   . Phenergan [Promethazine Hcl]   . Pyridium [Phenazopyridine Hcl]     Review of Systems  Constitutional: Negative for fever, chills, diaphoresis, activity change (activities limited due to foot injury one  year ago), appetite change, fatigue and unexpected weight change.  Eyes: Positive for visual disturbance.       Cataracts in both eyes  Respiratory: Positive for shortness of breath (Can walk one block on level ground or one flight of stairs). Negative for cough (No hemoptysis) and chest tightness.   Cardiovascular: Positive for leg swelling. Negative for chest pain and palpitations.  Endocrine:       Diabetes  Genitourinary:       Recurrent urinary tract infection  Musculoskeletal: Positive for joint swelling and arthralgias.  Neurological: Positive for dizziness and light-headedness. Negative for syncope and speech difficulty.  All other systems reviewed and are negative.    BP 150/86  Pulse 96  Resp 20  Ht 5' 4" (1.626 m)  Wt 185 lb (83.915 kg)  BMI 31.74 kg/m2  SpO2 96% Physical Exam  Vitals reviewed. Constitutional: She is oriented to person, place, and time. No distress.  Obese  HENT:  Head: Normocephalic and atraumatic.  Eyes: EOM are normal.  Neck: Neck supple. No tracheal deviation present. No thyromegaly present.  Cardiovascular: Normal rate, regular rhythm and normal heart sounds.  Exam reveals no gallop.   No murmur heard. Pulmonary/Chest: Effort normal and breath sounds normal. She has no wheezes. She has no rales.  Abdominal: Soft. There is no tenderness.  Musculoskeletal: She exhibits no edema.  Walks with a cane due to foot injury  Lymphadenopathy:    She has no cervical adenopathy.  Neurological: She is alert and oriented to person, place, and time. No cranial nerve deficit.  Skin: Skin is warm and dry.     Diagnostic Tests: CT of chest from [facility] 10/17/2012  Right middle lobe spiculated mass highly suspicious for pulmonary neoplasm. Question time metastatic nodules additionally both lungs. Recommend thoracic surgery consultation.  Impression: Mindy Martinez is a 72-year-old woman with a history of tobacco abuse who has a new right  middle lobe mass that measures 1.7 x 1.6 x 1.2 cm. This is highly suspicious for a primary bronchogenic carcinoma, and needs to be considered that until it can be proven otherwise. The way to definitively rule out cancer in this setting would be with an excisional biopsy. Unfortunately a needle biopsy or bronchoscopic biopsy would be prone to a relatively high false-negative rate. Given that she is a reasonably good surgical candidate my recommendation is that we proceed with a right VATS and right middle lobectomy for definitive diagnosis and treatment of a lung mass. She   does need pulmonary function testing to confirm that she can tolerate a lobectomy however, I don't believe that there will be any issue with that given that this is just a right middle lobe lesion.  I discussed the CT findings with Mindy Martinez. We discussed the differential diagnosis, potential diagnostic procedures, and treatment. She understands my preference for proceeding with definitive excisional biopsy and treatment with a lobectomy rather than attempting bronchoscopic or needle biopsies. I discussed the proposed surgical procedure with her, including the use of general anesthesia, incisions to be used, expected hospital stay, and short and long term outcomes. She understands the indications, risks, benefits, and alternatives. She understands the risk include but are not limited to, death, MI, DVT, PE, bleeding, possible need for transfusion, infection, prolonged air leak, cardiac arrhythmias, respiratory failure, as well as other organ system dysfunction such as renal or gastrointestinal complications.  She understands and accepts the risk and wishes to proceed.  Plan: 1. PFTs with room air blood gas.  2. She wishes to speak with a case manager regarding potential assisted-living or other assistance postoperatively  3. Right VATS, right middle lobectomy on Wednesday, September 3. 

## 2012-11-01 NOTE — Anesthesia Procedure Notes (Signed)
Procedure Name: Intubation Date/Time: 11/01/2012 11:23 AM Performed by: Luster Landsberg Pre-anesthesia Checklist: Patient identified, Emergency Drugs available, Suction available and Patient being monitored Patient Re-evaluated:Patient Re-evaluated prior to inductionOxygen Delivery Method: Circle system utilized Preoxygenation: Pre-oxygenation with 100% oxygen Intubation Type: IV induction Ventilation: Mask ventilation without difficulty and Oral airway inserted - appropriate to patient size Laryngoscope Size: Mac and 3 Grade View: Grade I Tube type: Oral Endobronchial tube: Left, Double lumen EBT, EBT position confirmed by auscultation and EBT position confirmed by fiberoptic bronchoscope and 37 Fr Number of attempts: 2 Airway Equipment and Method: Stylet Placement Confirmation: ETT inserted through vocal cords under direct vision,  positive ETCO2 and breath sounds checked- equal and bilateral Tube secured with: Tape Dental Injury: Teeth and Oropharynx as per pre-operative assessment  Comments: DVL x1 by CRNA. Grade I view, unable to pass DLT.  DVL by MDA with advancement of DLT.  Position confirmed by fiberoptic scope.

## 2012-11-02 ENCOUNTER — Encounter (HOSPITAL_COMMUNITY): Payer: Self-pay | Admitting: Thoracic Surgery (Cardiothoracic Vascular Surgery)

## 2012-11-02 ENCOUNTER — Inpatient Hospital Stay (HOSPITAL_COMMUNITY): Payer: Medicare Other

## 2012-11-02 LAB — POCT I-STAT 3, ART BLOOD GAS (G3+)
Bicarbonate: 24 mEq/L (ref 20.0–24.0)
Bicarbonate: 25.2 mEq/L — ABNORMAL HIGH (ref 20.0–24.0)
O2 Saturation: 98 %
Patient temperature: 97.8
TCO2: 26 mmol/L (ref 0–100)
TCO2: 27 mmol/L (ref 0–100)
pCO2 arterial: 53 mmHg — ABNORMAL HIGH (ref 35.0–45.0)
pH, Arterial: 7.303 — ABNORMAL LOW (ref 7.350–7.450)
pO2, Arterial: 116 mmHg — ABNORMAL HIGH (ref 80.0–100.0)

## 2012-11-02 LAB — GLUCOSE, CAPILLARY
Glucose-Capillary: 158 mg/dL — ABNORMAL HIGH (ref 70–99)
Glucose-Capillary: 140 mg/dL — ABNORMAL HIGH (ref 70–99)

## 2012-11-02 LAB — CBC
Platelets: 183 10*3/uL (ref 150–400)
RBC: 2.95 MIL/uL — ABNORMAL LOW (ref 3.87–5.11)
RDW: 12.6 % (ref 11.5–15.5)
WBC: 14.4 10*3/uL — ABNORMAL HIGH (ref 4.0–10.5)

## 2012-11-02 LAB — BASIC METABOLIC PANEL
Chloride: 110 mEq/L (ref 96–112)
GFR calc Af Amer: 20 mL/min — ABNORMAL LOW (ref >90)
Potassium: 4.5 mEq/L (ref 3.5–5.1)
Sodium: 143 mEq/L (ref 135–145)

## 2012-11-02 MED ORDER — AMITRIPTYLINE HCL 100 MG PO TABS
100.0000 mg | ORAL_TABLET | Freq: Every day | ORAL | Status: DC
  Administered 2012-11-02 – 2012-11-04 (×3): 100 mg via ORAL
  Filled 2012-11-02 (×4): qty 1

## 2012-11-02 MED ORDER — INSULIN DETEMIR 100 UNIT/ML ~~LOC~~ SOLN
20.0000 [IU] | Freq: Two times a day (BID) | SUBCUTANEOUS | Status: DC
  Administered 2012-11-02 (×2): 20 [IU] via SUBCUTANEOUS
  Filled 2012-11-02 (×3): qty 0.2

## 2012-11-02 MED ORDER — FUROSEMIDE 40 MG PO TABS
60.0000 mg | ORAL_TABLET | Freq: Every day | ORAL | Status: DC
  Administered 2012-11-02 – 2012-11-05 (×4): 60 mg via ORAL
  Filled 2012-11-02 (×4): qty 1

## 2012-11-02 MED ORDER — INSULIN ASPART 100 UNIT/ML ~~LOC~~ SOLN
0.0000 [IU] | Freq: Three times a day (TID) | SUBCUTANEOUS | Status: DC
  Administered 2012-11-02 (×2): 2 [IU] via SUBCUTANEOUS
  Administered 2012-11-02: 3 [IU] via SUBCUTANEOUS
  Administered 2012-11-03 (×2): 2 [IU] via SUBCUTANEOUS
  Administered 2012-11-03: 3 [IU] via SUBCUTANEOUS
  Administered 2012-11-04 (×2): 2 [IU] via SUBCUTANEOUS

## 2012-11-02 MED ORDER — ENOXAPARIN SODIUM 30 MG/0.3ML ~~LOC~~ SOLN
30.0000 mg | SUBCUTANEOUS | Status: DC
  Administered 2012-11-02 – 2012-11-05 (×4): 30 mg via SUBCUTANEOUS
  Filled 2012-11-02 (×4): qty 0.3

## 2012-11-02 MED ORDER — GUAIFENESIN ER 600 MG PO TB12
1200.0000 mg | ORAL_TABLET | Freq: Two times a day (BID) | ORAL | Status: DC
  Administered 2012-11-02 – 2012-11-05 (×7): 1200 mg via ORAL
  Filled 2012-11-02 (×9): qty 2

## 2012-11-02 MED ORDER — LINAGLIPTIN 5 MG PO TABS
5.0000 mg | ORAL_TABLET | Freq: Every day | ORAL | Status: DC
  Administered 2012-11-02 – 2012-11-05 (×4): 5 mg via ORAL
  Filled 2012-11-02 (×4): qty 1

## 2012-11-02 NOTE — Clinical Documentation Improvement (Signed)
THIS DOCUMENT IS NOT A PERMANENT PART OF THE MEDICAL RECORD  Please update your documentation with the medical record to reflect your response to this query. If you need help knowing how to do this please call [phone].  11/02/12   Dear Dr.Sheretta Grumbine/Associates,  In a better effort to capture your patient's severity of illness, reflect appropriate length of stay and utilization of resources, a review of the patient medical record has revealed the following indicators.    Based on your clinical judgment, please clarify and document in a progress note and/or discharge summary the clinical condition associated with the following supporting information:    Possible Clinical Conditions?     CKD Stage IV - GFR 15-29    CKD Stage V - GFR < 15    ESRD (End Stage Renal Disease)    Other condition    Cannot Clinically determine      Risk Factors:  CKD, creat down this AM, restart Lasix, noted per 9/04 progress notes.    Lab:  Bun: 9/02:  45 9/04:  38  Creat: 9/02:  3.12 9/04:  2.68  GFR:  9/02:  14 9/04:  17   You may use possible, probable, or suspect with inpatient documentation. possible, probable, suspected diagnoses MUST be documented at the time of discharge  Reviewed: additional documentation in the medical record   Thank You,  Marciano Sequin,  Clinical Documentation Specialist: [phone] Health Information Management [place]

## 2012-11-02 NOTE — Op Note (Signed)
NAMETONIETTE, DEVERA NO.:  [id number]  MEDICAL RECORD NO.:  [id number]  LOCATION:  2S09C                        FACILITY:  [facility]  PHYSICIAN:  Salvatore Decent. Dorris Fetch, M.D.DATE OF BIRTH:  12/02/1940  DATE OF PROCEDURE:  11/01/2012 DATE OF DISCHARGE:                              OPERATIVE REPORT   PREOPERATIVE DIAGNOSIS:  Right middle lobe mass.  POSTOPERATIVE DIAGNOSIS:  Non-small cell cancer, right middle lobe, clinical stage IA.  PROCEDURE:  Right video-assisted thoracoscopic right middle lobectomy and mediastinal lymph node dissection.  SURGEON:  Salvatore Decent. Dorris Fetch, M.D.  ASSISTANT:  Rowe Clack, P.A.-C.  ANESTHESIA:  General.  FINDINGS:  A 2-cm mass in the right middle lobe with involvement of the visceral pleura.  Frozen section revealed non-small-cell carcinoma, bronchial margin free of disease.  CLINICAL NOTE:  Ms. Wilinski is a 72 year old woman with a long history of tobacco abuse.  She recently saw Dr. Hanley Hays.  During the course of her workup, she was found to have a right middle lobe mass that was suspicious for a primary bronchogenic carcinoma on CT scan.  There was no evidence of hilar or mediastinal adenopathy.  She was advised to undergo thoracoscopic right middle lobectomy for definitive diagnosis and treatment.  The indications, risks, benefits, and alternatives to the procedure were discussed in detail with the patient.  She understood and accepted the risks and wished to proceed.  OPERATIVE NOTE:  Ms. Rowand was brought to the preoperative holding area on November 01, 2012.  There Anesthesia placed a central line and an arterial blood pressure monitoring line.  She was taken to the operating room.  Intravenous antibiotics were administered.  Sequential compression stockings were placed on the legs for DVT prophylaxis.  She was anesthetized and intubated.  Foley catheter was placed.  She was placed in the left lateral decubitus  position, and the right chest was prepped and draped in the usual sterile fashion.  An incision was made in approximately the eighth intercostal space in the midaxillary line was carried through the skin and subcutaneous tissue.  A port was inserted and 5-mm thoracoscope was placed into the chest.  There was significant air trapping, but no cross ventilation. Suction was applied to the bronchial port, but even with this it took quite a long time for the lung to deflate due to the extensive air trapping.  After the lung had deflated sufficiently to identify the fissures, an incision was made in the anterolateral chest.  This was 5 cm in length, it was carried through the skin and subcutaneous tissue.  The muscles were separated.  The intercostal muscles were divided with electrocautery.  All of the work during the procedure was done through this small utility incision.  No rib spreading was performed.  The inferior pulmonary ligament was identified and was divided with electrocautery up to the level of the inferior pulmonary vein.  No significant node was seen in the inferior ligament.  Next, the hilum was inspected.  The pleural reflection was divided at the hilum anteriorly. The superior and inferior pulmonary veins were clearly identified. Next, the major fissure was identified and electrocautery was used to develop the fissure exposing  the more superior of the pulmonary arterial branches to the middle lobe and the main pulmonary artery was identified as well.  The bronchus was identified there was a relatively large but otherwise benign appearing node between the right middle lobe bronchus and artery.  This was taken down and resulted in bleeding from the node, and this was sent as a separate level 11 node specimen.  The remaining pulmonary arterial branch to the middle lobe was identified and this was obstructed from being divided at this point by the right middle lobe bronchus.   Therefore, the more superior branch was divided with an endoscopic stapler.  Next, the major fissure was completed up to the level of the right middle lobe bronchus with an endoscopic GIA stapler. This allowed the right middle lobe bronchus to be encircled.  A 30-mm stapler with 3.5-mm staple depth was placed across the right middle lobe bronchus and closed but not fired.  A test inflation showed aeration of the upper and lower lobes and no aeration of the middle lobe. The stapler then was fired dividing the bronchus.  This exposed a second level 11 node which was posterior to the bronchus.  This was taken as a separate specimen.  The second more medial arterial branch to the right middle lobe was identified and was divided with an endoscopic vascular stapler.  The minor fissure then was completed with a single firing of the 60-mm endoscopic GIA stapler and finally the vein and remaining attachments were likewise stapled.  The specimen was placed in an endoscopic retrieval bag and removed, and sent for frozen section of the mass and the margin.  The mass turned out to be non-small cell carcinoma.  The margin was clear.  While awaiting the frozen section, the lymph node dissection was carried out initially in the hilum and a level 10 node was identified.  This was large but benign-appearing, it was sent as a separate specimen.  Next the level 4R nodes were dissected out.  The patient had quite a large azygos vein which was retracted upward exposing the 4R nodes which were taken in several pieces.  The entire paratracheal area was cleared.  Finally, the lung was retracted more anteriorly exposing the pleural reflection posteriorly.  This was divided with electrocautery.  The subcarinal nodes were dissected out and sent for permanent sections as well.  Surgicel was applied at the subcarinal and peritracheal node dissection sites.  The chest was copiously irrigated with warm saline.  A test  inflation revealed no significant air leakage. A second port type incision was made adjacent to the first.  A 28-French Blake drain was placed posteriorly and a 28-French chest tube was placed anteriorly.  These were secured with #1 silk sutures.  The lung was reinflated.  The small utility incision was closed with #1 Vicryl fascial suture followed by a 2-0 Vicryl subcutaneous suture and a 3-0 Vicryl subcuticular suture.  The chest tubes were placed to suction. The patient was extubated in the operating room and taken to the postanesthetic care unit in good condition.     Salvatore Decent Dorris Fetch, M.D.     SCH/MEDQ  D:  11/01/2012  T:  11/02/2012  Job:  [id number]

## 2012-11-02 NOTE — Progress Notes (Signed)
Patient ID: Mindy Martinez, female   DOB: 14-Aug-1940, 72 y.o.   MRN: [id number] PM ROUNDS  POD # 1 Right middle lobectomy  BP 154/69  Pulse 96  Temp(Src) 97.7 F (36.5 C) (Oral)  Resp 24  Ht 5' 2.99" (1.6 m)  Wt 186 lb 8.2 oz (84.6 kg)  BMI 33.05 kg/m2  SpO2 91%   Intake/Output Summary (Last 24 hours) at 11/02/12 1714 Last data filed at 11/02/12 1700  Gross per 24 hour  Intake   2540 ml  Output   1960 ml  Net    580 ml  ]  Comfortable, minimal pain  Doing well postop day 1  CKD- Stage V- good UO so far- follow

## 2012-11-02 NOTE — Care Management Note (Signed)
    Page 1 of 1   11/02/2012     11:43:11 AM   CARE MANAGEMENT NOTE 11/02/2012  Patient:  [facility]   Account Number:  [id number]  Date Initiated:  11/02/2012  Documentation initiated by:  Avie Arenas  Subjective/Objective Assessment:   Admitted to ICU post op VATS.     Action/Plan:   Anticipated DC Date:  11/09/2012   Anticipated DC Plan:  HOME W HOME HEALTH SERVICES      DC Planning Services  CM consult      Choice offered to / List presented to:             Status of service:  In process, will continue to follow Medicare Important Message given?   (If response is "NO", the following Medicare IM given date fields will be blank) Date Medicare IM given:   Date Additional Medicare IM given:    Discharge Disposition:    Per UR Regulation:    If discussed at Long Length of Stay Meetings, dates discussed:    Comments:  ContactBetzaira, [place] [phone]   [phone]   Antonique, [place] [phone]   [phone]  11-02-12 11:30am Avie Arenas, RNBSN [phone] Patient does live at home alone but oldest son will be staying with her post op and when he is not there states his son, her grandson will be there.  No other needs at this time.

## 2012-11-02 NOTE — Progress Notes (Signed)
1 Day Post-Op Procedure(s) (LRB): VIDEO ASSISTED THORACOSCOPY (VATS)/ LOBECTOMY (Right) Subjective: Feels well Not having much pain  Objective: Vital signs in last 24 hours: Temp:  [97.2 F (36.2 C)-98 F (36.7 C)] 97.8 F (36.6 C) (09/04 0429) Pulse Rate:  [65-87] 87 (09/04 0700) Cardiac Rhythm:  [-] Normal sinus rhythm (09/04 0700) Resp:  [12-29] 15 (09/04 0700) BP: (107-184)/(50-76) 150/61 mmHg (09/04 0700) SpO2:  [95 %-100 %] 100 % (09/04 0734) Arterial Line BP: (140-188)/(48-81) 169/53 mmHg (09/04 0700) Weight:  [186 lb 8.2 oz (84.6 kg)] 186 lb 8.2 oz (84.6 kg) (09/03 1700)  Hemodynamic parameters for last 24 hours:    Intake/Output from previous day: 09/03 0701 - 09/04 0700 In: 4135 [I.V.:3785; IV Piggyback:350] Out: 2455 [Urine:1695; Blood:200; Chest Tube:560] Intake/Output this shift:    General appearance: alert and no distress Neurologic: intact Heart: regular rate and rhythm Lungs: diminished breath sounds bibasilar Abdomen: normal findings: soft, non-tender no air leak  Lab Results:  Recent Labs  10/31/12 1031 11/02/12 0420  WBC 9.8 14.4*  HGB 11.0* 9.1*  HCT 32.3* 27.0*  PLT 238 183   BMET:  Recent Labs  10/31/12 1031 11/02/12 0420  NA 138 143  K 3.5 4.5  CL 103 110  CO2 24 23  GLUCOSE 119* 199*  BUN 45* 38*  CREATININE 3.12* 2.68*  CALCIUM 9.2 8.1*    PT/INR:  Recent Labs  10/31/12 1031  LABPROT 13.1  INR 1.01   ABG    Component Value Date/Time   PHART 7.303* 11/02/2012 0525   HCO3 25.2* 11/02/2012 0525   TCO2 27 11/02/2012 0525   ACIDBASEDEF 2.0 11/02/2012 0525   O2SAT 94.0 11/02/2012 0525   CBG (last 3)   Recent Labs  11/01/12 2018 11/01/12 2327 11/02/12 0426  GLUCAP 168* 189* 155*    Assessment/Plan: S/P Procedure(s) (LRB): VIDEO ASSISTED THORACOSCOPY (VATS)/ LOBECTOMY (Right) POD # 1 RML cv- stable RESP- CXR shows fairly extensive atelectasis, hypercarbia on ABG  Continue bronchodilators, add flutter valve and  mucinex, encourage cough  No air leak- keep both CT  RENAL- CKD- creatinine down this AM- restart lasix, follow  Lovenox + SCD for DVT prophylaxis  OOB, ambulate   LOS: 1 day    Sabastien Tyler C 11/02/2012

## 2012-11-03 ENCOUNTER — Encounter (HOSPITAL_COMMUNITY): Payer: Self-pay | Admitting: Infectious Diseases

## 2012-11-03 ENCOUNTER — Inpatient Hospital Stay (HOSPITAL_COMMUNITY): Payer: Medicare Other

## 2012-11-03 LAB — COMPREHENSIVE METABOLIC PANEL
Albumin: 2.5 g/dL — ABNORMAL LOW (ref 3.5–5.2)
Alkaline Phosphatase: 77 U/L (ref 39–117)
BUN: 32 mg/dL — ABNORMAL HIGH (ref 6–23)
CO2: 22 mEq/L (ref 19–32)
Chloride: 106 mEq/L (ref 96–112)
Creatinine, Ser: 2.53 mg/dL — ABNORMAL HIGH (ref 0.50–1.10)
GFR calc non Af Amer: 18 mL/min — ABNORMAL LOW (ref >90)
Glucose, Bld: 148 mg/dL — ABNORMAL HIGH (ref 70–99)
Potassium: 4.1 mEq/L (ref 3.5–5.1)
Total Bilirubin: 0.2 mg/dL — ABNORMAL LOW (ref 0.3–1.2)

## 2012-11-03 LAB — CBC
HCT: 29.3 % — ABNORMAL LOW (ref 36.0–46.0)
Hemoglobin: 9.6 g/dL — ABNORMAL LOW (ref 12.0–15.0)
MCV: 91.8 fL (ref 78.0–100.0)
RBC: 3.19 MIL/uL — ABNORMAL LOW (ref 3.87–5.11)
RDW: 12.9 % (ref 11.5–15.5)
WBC: 14.8 10*3/uL — ABNORMAL HIGH (ref 4.0–10.5)

## 2012-11-03 LAB — GLUCOSE, CAPILLARY
Glucose-Capillary: 145 mg/dL — ABNORMAL HIGH (ref 70–99)
Glucose-Capillary: 121 mg/dL — ABNORMAL HIGH (ref 70–99)

## 2012-11-03 MED ORDER — INSULIN DETEMIR 100 UNIT/ML ~~LOC~~ SOLN
25.0000 [IU] | Freq: Two times a day (BID) | SUBCUTANEOUS | Status: DC
  Administered 2012-11-03 – 2012-11-04 (×4): 25 [IU] via SUBCUTANEOUS
  Filled 2012-11-03 (×6): qty 0.25

## 2012-11-03 MED ORDER — AMLODIPINE BESYLATE 10 MG PO TABS
10.0000 mg | ORAL_TABLET | Freq: Every day | ORAL | Status: DC
  Administered 2012-11-03 – 2012-11-05 (×3): 10 mg via ORAL
  Filled 2012-11-03 (×3): qty 1

## 2012-11-03 NOTE — Progress Notes (Signed)
RT to place patient on 2lpm [place]. Patient's sat is ranging from 90-93% on room air.

## 2012-11-03 NOTE — Progress Notes (Signed)
Patient arrived to unit via wheel chair accompanied by RN. Patient ambulated to the bed. Patient placed on monitors and assessed. Patient has no current pain or complaint at this time. Call bell and phone in reach family at bedside. Will continue to monitor.

## 2012-11-03 NOTE — Progress Notes (Signed)
2 Days Post-Op Procedure(s) (LRB): VIDEO ASSISTED THORACOSCOPY (VATS)/ LOBECTOMY (Right) Subjective: Some discomfort at foley No significant incisional pain  Objective: Vital signs in last 24 hours: Temp:  [97.6 F (36.4 C)-98.7 F (37.1 C)] 98.6 F (37 C) (09/05 0741) Pulse Rate:  [50-113] 83 (09/05 0700) Cardiac Rhythm:  [-] Sinus tachycardia (09/05 0600) Resp:  [15-31] 15 (09/05 0700) BP: (137-176)/(58-83) 167/68 mmHg (09/05 0700) SpO2:  [91 %-100 %] 100 % (09/05 0700) Arterial Line BP: (154)/(48) 154/48 mmHg (09/04 0800) Weight:  [190 lb 0.6 oz (86.2 kg)] 190 lb 0.6 oz (86.2 kg) (09/05 0500)  Hemodynamic parameters for last 24 hours:    Intake/Output from previous day: 09/04 0701 - 09/05 0700 In: 2076 [P.O.:780; I.V.:1296] Out: 2310 [Urine:1980; Chest Tube:330] Intake/Output this shift:    General appearance: alert, cooperative and no distress Neurologic: intact Heart: regular rate and rhythm Lungs: better air movement today, no wheezing no air leak, serous drainage from CT  Lab Results:  Recent Labs  11/02/12 0420 11/03/12 0414  WBC 14.4* 14.8*  HGB 9.1* 9.6*  HCT 27.0* 29.3*  PLT 183 208   BMET:  Recent Labs  11/02/12 0420 11/03/12 0414  NA 143 138  K 4.5 4.1  CL 110 106  CO2 23 22  GLUCOSE 199* 148*  BUN 38* 32*  CREATININE 2.68* 2.53*  CALCIUM 8.1* 8.3*    PT/INR:  Recent Labs  10/31/12 1031  LABPROT 13.1  INR 1.01   ABG    Component Value Date/Time   PHART 7.303* 11/02/2012 0525   HCO3 25.2* 11/02/2012 0525   TCO2 27 11/02/2012 0525   ACIDBASEDEF 2.0 11/02/2012 0525   O2SAT 94.0 11/02/2012 0525   CBG (last 3)   Recent Labs  11/02/12 1148 11/02/12 1614 11/02/12 2137  GLUCAP 131* 137* 140*    Assessment/Plan: S/P Procedure(s) (LRB): VIDEO ASSISTED THORACOSCOPY (VATS)/ LOBECTOMY (Right) Plan for transfer to step-down: see transfer orders  POD # 2 RML- doing well Path still pending  CV- HTN- will start norvasc as she is  consistently in 160s  RESP- No air leak- dc anterior CT, posterior CT to water seal  CXR shows improved atelectasis in right lung  RENAL- stage V CKD- creatinine 2.5- baseline 3.1-3.5- follow  DVT prophylaxis- enoxaparin and SCD  Ambulate  Transfer to 3300 when bed available   LOS: 2 days    Mindy Martinez C 11/03/2012

## 2012-11-03 NOTE — Discharge Summary (Signed)
Physician Discharge Summary       [address] [place]       [name] [address]             [phone]    Patient ID: Mindy Martinez MRN: [id number] DOB/AGE: 30-Sep-1940 72 y.o.  Admit date: 11/01/2012 Discharge date: 11/03/2012  Admission Diagnoses: 1.Right middle lobe mass 2. 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 History of hyperlipidemia 3.History of tobacco abuse  4.History of hypertension 5.History of DM (type II) 6.History of depression   Procedure (s):  Right video-assisted thoracoscopic right middle lobectomy  and mediastinal lymph node dissection by Dr. Dorris Fetch on 11/01/2012.   Pathology: Diagnosis 1. Lung, resection (segmental or lobe), Right middle lobe - CARCINOID TUMOR, 1.5 CM. - SEPARATE INCIDENTAL CARCINOID TUMORLET - LYMPHOVASCULAR INVASION IS IDENTIFIED. - THE SURGICAL RESECTION MARGINS ARE NEGATIVE FOR TUMOR. - SEE ONCOLOGY TABLE BELOW. 2. Lymph node, biopsy, Level 11 - THERE IS NO EVIDENCE OF MALIGNANCY IN 1 OF 1 LYMPH NODE (0/1). 3. Lymph node, biopsy, Level 11 #2 - THERE IS NO EVIDENCE OF MALIGNANCY IN 1 OF 1 LYMPH NODE (0/1). 4. 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 5. Lymph node, biopsy, 4 R - THERE IS NO EVIDENCE OF MALIGNANCY IN 1 OF 1 LYMPH NODE (0/1). 6. Lymph node, biopsy, Level 7 - THERE IS NO EVIDENCE OF MALIGNANCY IN 1 OF 1 LYMPH NODE (0/1). Microscopic Comment   History of Presenting Illness: This is a 72 year old woman with a history of tobacco abuse (60 pack years) and diabetes. She quit smoking in 2007.  She recently had a visit from a home health nurse who was sent by her insurance company. She says they do this once a year. The nurse noted a heart murmur. She was referred to Dr. Hanley Hays. Dr. Hanley Hays did an  echocardiogram which showed normal left ventricular function and no significant valvular pathology. As part of her workup, she also had a chest x-ray which showed a question of a right upper lobe nodule. A CT scan was done which showed that there was no right upper lobe nodule. However, there was a mass in her right middle lobe, suspicious for primary bronchogenic carcinoma. There was no hilar or mediastinal adenopathy. A stress test was also done which showed normal ejection fraction and no evidence of ischemia. The patient says that she can walk a block before getting short of breath. She also can walk up a flight of stairs. She's not had any chest pain or pressure or tightness. She's not had any appetite change, fatigue or weight loss. She smoked about a pack and a half a day from age 89 up until 2007 when she quit. She lives alone. She has 2 sons who do live in the area. She was seen in consultation by Dr. Dorris Fetch regarding the RML mass. He discussed the need for her to have a right VATS and right middle lobectomy. PFTs were obtained prior to surgery.Potential risks, complications, and benefits of the surgery were discussed with the patient and she agreed to proceed. She underwent the aforementioned lung surgery on 11/01/2012.  Brief Hospital Course: The patient has progressed nicely. She has remained hemodynamically stable. All routine lines, monitors and drainage devices have been discontinued in the standard fashion. She is tolerating gradually increasing activities  using standard protocols. Oxygen was weaned without difficulty. Incisions are healing well without evidence of infection. She had a mild acute blood loss anemia and values have stabilized. Overall status is stable at discharge.    Latest Vital Signs: Blood pressure 158/81, pulse 112, temperature 98.6 F (37 C), temperature source Oral, resp. rate 24, height 5' 2.99" (1.6 m), weight 86.2 kg (190 lb 0.6 oz), SpO2 99.00%.    Discharge  Condition:Stable   Recent laboratory studies:  Lab Results  Component Value Date   WBC 14.8* November 29, 2012   HGB 9.6* 11/29/2012   HCT 29.3* 11-29-2012   MCV 91.8 November 29, 2012   PLT 208 11-29-2012   Lab Results  Component Value Date   NA 138 11-29-2012   K 4.1 11/29/12   CL 106 11/29/12   CO2 22 11-29-12   CREATININE 2.53* 29-Nov-2012   GLUCOSE 148* 2012/11/29      Diagnostic Studies:   Dg Chest Port 1 View  29-Nov-2012   *RADIOLOGY REPORT*  Clinical Data: Status post VATS with chest tubes in place  PORTABLE CHEST - 1 VIEW  Comparison: Prior chest x-ray 11/02/2012  Findings: Trace right apical pneumothorax with two right-sided thoracostomy tubes in place.  Right IJ central venous catheter in stable position with the tip in the mid SVC.  Slightly increased interstitial edema. Surgical changes of right middle lobectomy with linear atelectasis versus postoperative change in the right mid lung.  Stable enlargement the cardiopericardial silhouette. Persistent bibasilar atelectasis versus infiltrate.  IMPRESSION:  1.  Small right apical pneumothorax with two right-sided thoracostomy tubes in place. 2.  Slightly increased interstitial pulmonary edema. 3.  Persistent bibasilar and right mid lung atelectasis versus postoperative change status post right middle lobectomy.   Original Report Authenticated By: Malachy Moan, M.D.      Future Appointments Provider Department Dept Phone   11/21/2012 10:30 AM Loreli Slot, MD [facility] [phone]      Discharge Medications:   Medication List    STOP taking these medications       multivitamin tablet      TAKE these medications       alendronate 70 MG tablet  Commonly known as:  FOSAMAX  Take 70 mg by mouth every 7 (seven) days.     amitriptyline 100 MG tablet  Commonly known as:  ELAVIL  Take 100 mg by mouth at bedtime.     amLODipine 10 MG tablet  Commonly known as:  NORVASC  Take 1 tablet (10  mg total) by mouth daily.     aspirin 81 MG tablet  Take 81 mg by mouth daily.     atorvastatin 40 MG tablet  Commonly known as:  LIPITOR  Take 40 mg by mouth at bedtime.     budesonide-formoterol 160-4.5 MCG/ACT inhaler  Commonly known as:  SYMBICORT  Inhale 2 puffs into the lungs 2 (two) times daily.     furosemide 40 MG tablet  Commonly known as:  LASIX  Take 60 mg by mouth daily. TAKES 1 AND 1/2 TABS DAILY     insulin NPH-regular (70-30) 100 UNIT/ML injection  Commonly known as:  NOVOLIN 70/30  Inject 22-47 Units into the skin 2 (two) times daily with a meal. 47 units in the am, and 22 units before dinner.     latanoprost 0.005 % ophthalmic solution  Commonly known as:  XALATAN  Place 1 drop into both eyes at bedtime.     linagliptin 5 MG  Tabs tablet  Commonly known as:  TRADJENTA  Take 5 mg by mouth daily.     oxyCODONE-acetaminophen 5-325 MG per tablet  Commonly known as:  PERCOCET/ROXICET  Take 1-2 tablets by mouth every 4 (four) hours as needed.     traMADol 50 MG tablet  Commonly known as:  ULTRAM  Take 50 mg by mouth every 8 (eight) hours as needed.     ULORIC 40 MG tablet  Generic drug:  febuxostat  Take 40 mg by mouth daily.     vitamin B-12 1000 MCG tablet  Commonly known as:  CYANOCOBALAMIN  Take 1,000 mcg by mouth daily.     VITAMIN D PO  Take 1 tablet by mouth at bedtime.        Follow Up Appointments:     Follow-up Information   Follow up with HENDRICKSON,STEVEN C, MD. (PA/LAT CXR to be taken (at [facility] which is in the same building as Dr. Sunday Corn office) on 11/21/2012 at 9:30 am;Appointment with Dr. Dorris Fetch is on 11/21/2012 at 10:30 am)    Specialty:  Cardiothoracic Surgery   Contact information:   [address] [place] [address] [phone]       Signed: Doree Fudge MPA-C 11/03/2012, 1:17 PM

## 2012-11-03 NOTE — Progress Notes (Signed)
   PATH  FINAL DIAGNOSIS Diagnosis 1. Lung, resection (segmental or lobe), Right middle lobe - CARCINOID TUMOR, 1.5 CM. - SEPARATE INCIDENTAL CARCINOID TUMORLET - LYMPHOVASCULAR INVASION IS IDENTIFIED. - THE SURGICAL RESECTION MARGINS ARE NEGATIVE FOR TUMOR. - SEE ONCOLOGY TABLE BELOW. 2. Lymph node, biopsy, Level 11 - THERE IS NO EVIDENCE OF MALIGNANCY IN 1 OF 1 LYMPH NODE (0/1). 3. Lymph node, biopsy, Level 11 #2 - THERE IS NO EVIDENCE OF MALIGNANCY IN 1 OF 1 LYMPH NODE (0/1). 4. 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 5. Lymph node, biopsy, 4 R - THERE IS NO EVIDENCE OF MALIGNANCY IN 1 OF 1 LYMPH NODE (0/1). 6. Lymph node, biopsy, Level 7 - THERE IS NO EVIDENCE OF MALIGNANCY IN 1 OF 1 LYMPH NODE (0/1). Microscopic Comment 1. LUNG Specimen, including laterality: Right middle lobe Procedure: Lobectomy Specimen integrity (intact/disrupted): Intact Tumor site: Right middle lobe Tumor focality: The main tumor is 1.5 cm, and there is a separate incidental carcinoid tumorlet present in grossly unremarkable lung tissue. Maximum tumor size (cm): 1 .5 cm Histologic type: Carcinoid tumor (neuroendocrine tumor) Grade: Low grade Margins: Negative Distance to closest margin (cm): 4.5 cm to the bronchial margin (gross measurement) Visceral pleura invasion: Not identified Tumor extension: Confined to lung parenchyma Treatment effect (if treated with neoadjuvant therapy): N/A 1 of 3 FINAL for Tapia, Kasi ([id number]) Microscopic Comment(continued) Lymph -Vascular invasion: Present Lymph nodes: Number examined - 5; Number N1 nodes positive 1; Number N2 nodes positive 0 TNM code: pT1a, pN1 Ancillary studies: The tumor cells are positive for CD56, synaptophysin, chromogranin and faintly positive for EMA and TTF-1. They are essentially negative for Napsin-A, p63, cytokeratin 5/6 and S100. (JBK:kh 11/03/12) Pecola Leisure MD Pathologist, Electronic  Signature (Case signed 11/03/2012) Intraoperative Diagnosis LUNG, RIGHT MIDDLE LOBECTOMY: FROZEN SECTION A, BRONCHIAL MARGIN: MARGIN BENIGN FROZEN SECTION B, LESION: NON-SMALL CELL CARCINOMA  Discussed Pathology with patient- Stage IIA carcinoid (T1N1)  Will have her seen by [facility] as outpatient per her request

## 2012-11-04 ENCOUNTER — Inpatient Hospital Stay (HOSPITAL_COMMUNITY): Payer: Medicare Other

## 2012-11-04 LAB — GLUCOSE, CAPILLARY
Glucose-Capillary: 115 mg/dL — ABNORMAL HIGH (ref 70–99)
Glucose-Capillary: 143 mg/dL — ABNORMAL HIGH (ref 70–99)

## 2012-11-04 LAB — BASIC METABOLIC PANEL
CO2: 22 mEq/L (ref 19–32)
Calcium: 8.8 mg/dL (ref 8.4–10.5)
GFR calc non Af Amer: 17 mL/min — ABNORMAL LOW (ref >90)
Potassium: 3.4 mEq/L — ABNORMAL LOW (ref 3.5–5.1)
Sodium: 138 mEq/L (ref 135–145)

## 2012-11-04 MED ORDER — ALBUTEROL SULFATE (5 MG/ML) 0.5% IN NEBU
2.5000 mg | INHALATION_SOLUTION | Freq: Three times a day (TID) | RESPIRATORY_TRACT | Status: DC
  Administered 2012-11-04 – 2012-11-05 (×2): 2.5 mg via RESPIRATORY_TRACT
  Filled 2012-11-04 (×2): qty 0.5

## 2012-11-04 NOTE — Progress Notes (Addendum)
TCTS DAILY ICU PROGRESS NOTE                   [address]            [name] [address]          [phone]   3 Days Post-Op Procedure(s) (LRB): VIDEO ASSISTED THORACOSCOPY (VATS)/ LOBECTOMY (Right)  Total Length of Stay:  LOS: 3 days   Subjective: Feels well for the most part, some discomfort, non productive cough  Objective: Vital signs in last 24 hours: Temp:  [97.9 F (36.6 C)-99.7 F (37.6 C)] 98.5 F (36.9 C) (09/06 0700) Pulse Rate:  [99-112] 99 (09/06 0700) Cardiac Rhythm:  [-] Normal sinus rhythm;Sinus tachycardia (09/06 0800) Resp:  [19-26] 19 (09/06 0700) BP: (137-153)/(57-74) 143/63 mmHg (09/06 0700) SpO2:  [93 %-99 %] 93 % (09/06 0926) FiO2 (%):  [98 %] 98 % (09/05 1200)  Filed Weights   11/01/12 1700 11/03/12 0500  Weight: 186 lb 8.2 oz (84.6 kg) 190 lb 0.6 oz (86.2 kg)    Weight change:    Hemodynamic parameters for last 24 hours:    Intake/Output from previous day: 09/05 0701 - 09/06 0700 In: 280 [P.O.:120; I.V.:160] Out: 1490 [Urine:1100; Chest Tube:390]  Intake/Output this shift: Total I/O In: -  Out: 50 [Chest Tube:50]  Current Meds: Scheduled Meds: . albuterol  2.5 mg Nebulization QID  . amitriptyline  100 mg Oral QHS  . amLODipine  10 mg Oral Daily  . aspirin  81 mg Oral Daily  . atorvastatin  40 mg Oral q1800  . bisacodyl  10 mg Oral Daily  . budesonide-formoterol  2 puff Inhalation BID  . enoxaparin (LOVENOX) injection  30 mg Subcutaneous Q24H  . febuxostat  40 mg Oral Daily  . fentaNYL   Intravenous Q4H  . furosemide  60 mg Oral Daily  . guaiFENesin  1,200 mg Oral BID  . insulin aspart  0-15 Units Subcutaneous TID WC  . insulin detemir  25 Units Subcutaneous BID  . latanoprost  1 drop Both Eyes QHS  . linagliptin  5 mg Oral Daily   Continuous Infusions: . dextrose 5 % and 0.9% NaCl 20 mL/hr at 11/04/12 0600   PRN Meds:.albuterol, diphenhydrAMINE, diphenhydrAMINE, naloxone, ondansetron (ZOFRAN) IV,  oxyCODONE-acetaminophen, potassium chloride, senna-docusate, sodium chloride, traMADol  General appearance: alert, cooperative and no distress Heart: regular rate and rhythm and tachy Lungs: mildly dim in bases Abdomen: soft, nontender Extremities: no edema Wound: incis healing well  Lab Results: CBC: Recent Labs  11/02/12 0420 11/03/12 0414  WBC 14.4* 14.8*  HGB 9.1* 9.6*  HCT 27.0* 29.3*  PLT 183 208   BMET:  Recent Labs  11/03/12 0414 11/04/12 0350  NA 138 138  K 4.1 3.4*  CL 106 102  CO2 22 22  GLUCOSE 148* 116*  BUN 32* 32*  CREATININE 2.53* 2.64*  CALCIUM 8.3* 8.8    PT/INR: No results found for this basename: LABPROT, INR,  in the last 72 hours Radiology: Dg Chest Port 1 View  11/04/2012   *RADIOLOGY REPORT*  Clinical Data: Evaluate chest tube.  PORTABLE CHEST - 1 VIEW  Comparison: Chest x-ray of 11/03/2012.  Findings: One right-sided chest tube remains in position with tip and side port projecting over the right hemithorax.  The other previously noted right-sided chest tube has been removed.  No definite pneumothorax identified on today's examination.  Extensive bibasilar linear opacities may reflect areas of atelectasis and/or consolidation.  Small left  pleural effusion.  No evidence of pulmonary edema.  Heart size is within normal limits.  Mediastinal contours are unremarkable.  Atherosclerosis of the thoracic aorta. There is a right-sided internal jugular central venous catheter with tip terminating in the mid superior vena cava.  IMPRESSION: 1.  Support apparatus, as above. 2.  Interval resolution of previously suspected right sided pneumothorax. 3.  Patchy opacities throughout the lung bases bilaterally may reflect areas of atelectasis and/or consolidation.  Small left pleural effusion. 4.  Atherosclerosis.   Original Report Authenticated By: Trudie Reed, M.D.   Dg Chest Port 1 View  11/03/2012   *RADIOLOGY REPORT*  Clinical Data: Status post VATS with chest  tubes in place  PORTABLE CHEST - 1 VIEW  Comparison: Prior chest x-ray 11/02/2012  Findings: Trace right apical pneumothorax with two right-sided thoracostomy tubes in place.  Right IJ central venous catheter in stable position with the tip in the mid SVC.  Slightly increased interstitial edema. Surgical changes of right middle lobectomy with linear atelectasis versus postoperative change in the right mid lung.  Stable enlargement the cardiopericardial silhouette. Persistent bibasilar atelectasis versus infiltrate.  IMPRESSION:  1.  Small right apical pneumothorax with two right-sided thoracostomy tubes in place. 2.  Slightly increased interstitial pulmonary edema. 3.  Persistent bibasilar and right mid lung atelectasis versus postoperative change status post right middle lobectomy.   Original Report Authenticated By: Malachy Moan, M.D.     Assessment/Plan: S/P Procedure(s) (LRB): VIDEO ASSISTED THORACOSCOPY (VATS)/ LOBECTOMY (Right)  1 doing well 2 d/c chest tube- no air leak 3 d/c pca 4 cont pulm toilet/rehab 5 poss home in am 6 Creat fairly stable 7 BP somewhat better on norvasc- follow 8 sugars well controlled GOLD,WAYNE E 11/04/2012 10:07 AM  Chart reviewed, patient examined, agree with above. CXR looks ok after CT removal with no ptx. Can go home tomorrow if no changes.

## 2012-11-05 ENCOUNTER — Inpatient Hospital Stay (HOSPITAL_COMMUNITY): Payer: Medicare Other

## 2012-11-05 LAB — GLUCOSE, CAPILLARY: Glucose-Capillary: 77 mg/dL (ref 70–99)

## 2012-11-05 MED ORDER — OXYCODONE-ACETAMINOPHEN 5-325 MG PO TABS: 1.0000 | ORAL_TABLET | ORAL | Status: DC | PRN

## 2012-11-05 MED ORDER — AMLODIPINE BESYLATE 10 MG PO TABS: 10.0000 mg | ORAL_TABLET | Freq: Every day | ORAL | Status: DC

## 2012-11-05 NOTE — Progress Notes (Signed)
TCTS DAILY ICU PROGRESS NOTE                   [address]            [name] [address]          [phone]   4 Days Post-Op Procedure(s) (LRB): VIDEO ASSISTED THORACOSCOPY (VATS)/ LOBECTOMY (Right)  Total Length of Stay:  LOS: 4 days   Subjective: Feels fairly well, improving daily  Objective: Vital signs in last 24 hours: Temp:  [97.9 F (36.6 C)-98.7 F (37.1 C)] 97.9 F (36.6 C) (09/07 0700) Pulse Rate:  [99-107] 99 (09/07 0338) Cardiac Rhythm:  [-] Sinus tachycardia (09/06 1949) Resp:  [19-24] 19 (09/07 0338) BP: (109-144)/(42-58) 109/47 mmHg (09/07 0338) SpO2:  [93 %-97 %] 96 % (09/07 0817)  Filed Weights   11/01/12 1700 11/03/12 0500  Weight: 186 lb 8.2 oz (84.6 kg) 190 lb 0.6 oz (86.2 kg)    Weight change:    Hemodynamic parameters for last 24 hours:    Intake/Output from previous day: 09/06 0701 - 09/07 0700 In: 1620 [P.O.:1080; I.V.:540] Out: 1101 [Urine:1051; Chest Tube:50]  Intake/Output this shift:    Current Meds: Scheduled Meds: . albuterol  2.5 mg Nebulization TID  . amitriptyline  100 mg Oral QHS  . amLODipine  10 mg Oral Daily  . aspirin  81 mg Oral Daily  . atorvastatin  40 mg Oral q1800  . bisacodyl  10 mg Oral Daily  . budesonide-formoterol  2 puff Inhalation BID  . enoxaparin (LOVENOX) injection  30 mg Subcutaneous Q24H  . febuxostat  40 mg Oral Daily  . furosemide  60 mg Oral Daily  . guaiFENesin  1,200 mg Oral BID  . insulin aspart  0-15 Units Subcutaneous TID WC  . insulin detemir  25 Units Subcutaneous BID  . latanoprost  1 drop Both Eyes QHS  . linagliptin  5 mg Oral Daily   Continuous Infusions: . dextrose 5 % and 0.9% NaCl 20 mL/hr at 11/04/12 0600   PRN Meds:.albuterol, oxyCODONE-acetaminophen, potassium chloride, senna-docusate, traMADol  General appearance: alert, cooperative and no distress Heart: regular rate and rhythm Lungs: dim in right lower fields Abdomen: benign Extremities: warm well  perfused Wound: incis healing well  Lab Results: CBC: Recent Labs  11/03/12 0414  WBC 14.8*  HGB 9.6*  HCT 29.3*  PLT 208   BMET:  Recent Labs  11/03/12 0414 11/04/12 0350  NA 138 138  K 4.1 3.4*  CL 106 102  CO2 22 22  GLUCOSE 148* 116*  BUN 32* 32*  CREATININE 2.53* 2.64*  CALCIUM 8.3* 8.8    PT/INR: No results found for this basename: LABPROT, INR,  in the last 72 hours Radiology: Dg Chest 2 View  11/05/2012   *RADIOLOGY REPORT*  Clinical Data: Chest pain.  CHEST - 2 VIEW  Comparison: Chest x-ray 11/04/2012.  Findings: There is a right-sided internal jugular central venous catheter with tip terminating in the mid superior vena cava. No definite right-sided pneumothorax is confidently identified at this time.  Extensive ill-defined interstitial and airspace opacities throughout the right mid to lower lung are again noted.  Trace left pleural effusion.  No evidence of pulmonary edema.  Heart size is normal.  Mediastinal contours are unremarkable.  Extensive subcutaneous emphysema throughout the right chest wall is again noted.  IMPRESSION: 1.  Allowing for slight differences in patient positioning and technique, the radiographic appearance of the chest is essentially unchanged, as  above.   Original Report Authenticated By: Trudie Reed, M.D.   Dg Chest Port 1 View  11/04/2012   *RADIOLOGY REPORT*  Clinical Data: Evaluate chest tube.  PORTABLE CHEST - 1 VIEW  Comparison: Chest x-ray of 11/03/2012.  Findings: One right-sided chest tube remains in position with tip and side port projecting over the right hemithorax.  The other previously noted right-sided chest tube has been removed.  No definite pneumothorax identified on today's examination.  Extensive bibasilar linear opacities may reflect areas of atelectasis and/or consolidation.  Small left pleural effusion.  No evidence of pulmonary edema.  Heart size is within normal limits.  Mediastinal contours are unremarkable.   Atherosclerosis of the thoracic aorta. There is a right-sided internal jugular central venous catheter with tip terminating in the mid superior vena cava.  IMPRESSION: 1.  Support apparatus, as above. 2.  Interval resolution of previously suspected right sided pneumothorax. 3.  Patchy opacities throughout the lung bases bilaterally may reflect areas of atelectasis and/or consolidation.  Small left pleural effusion. 4.  Atherosclerosis.   Original Report Authenticated By: Trudie Reed, M.D.   Dg Chest Port 1v Same Day  11/04/2012   *RADIOLOGY REPORT*  Clinical Data: Chest tube removal.  PORTABLE CHEST - 1 VIEW SAME DAY  Comparison: Chest x-ray 11/04/2012.  Findings: Previously noted right-sided chest tube has been removed. No definite right apical pneumothorax is identified. There is a right-sided internal jugular central venous catheter with tip terminating in the mid superior vena cava. Lung volumes are slightly low.  Patchy multifocal interstitial and airspace opacities throughout the right mid to lower lung in the left base, similar to the prior study, which may reflect areas of atelectasis and/or consolidation.  Small left pleural effusion is unchanged. No evidence of pulmonary edema.  Heart size is normal.  Mediastinal contours are distorted by patient rotation to the left. Subcutaneous emphysema throughout the right chest wall again noted.  IMPRESSION: 1.  Status post removal of right-sided chest tube.  No pneumothorax. 2.  The radiographic appearance of the chest is otherwise essentially unchanged, as above.   Original Report Authenticated By: Trudie Reed, M.D.     Assessment/Plan: S/P Procedure(s) (LRB): VIDEO ASSISTED THORACOSCOPY (VATS)/ LOBECTOMY (Right) Plan for discharge: see discharge orders     GOLD,WAYNE E 11/05/2012 11:21 AM

## 2012-11-05 NOTE — Progress Notes (Signed)
Pt d/c home per MD order, pt VSS, family at [facility] all questions answered, d/c instructions given, pt verbalized understanding of d/c

## 2012-11-20 ENCOUNTER — Other Ambulatory Visit: Payer: Self-pay | Admitting: *Deleted

## 2012-11-20 DIAGNOSIS — R918 Other nonspecific abnormal finding of lung field: Secondary | ICD-10-CM

## 2012-11-21 ENCOUNTER — Ambulatory Visit (INDEPENDENT_AMBULATORY_CARE_PROVIDER_SITE_OTHER): Payer: Medicare Other | Admitting: Thoracic Surgery (Cardiothoracic Vascular Surgery)

## 2012-11-21 ENCOUNTER — Ambulatory Visit
Admission: RE | Admit: 2012-11-21 | Discharge: 2012-11-21 | Disposition: A | Discharge status: Home / Self Care | Payer: Medicare Other | Source: Ambulatory Visit | Attending: Thoracic Surgery (Cardiothoracic Vascular Surgery) | Admitting: Thoracic Surgery (Cardiothoracic Vascular Surgery)

## 2012-11-21 ENCOUNTER — Encounter: Payer: Self-pay | Admitting: Thoracic Surgery (Cardiothoracic Vascular Surgery)

## 2012-11-21 VITALS — BP 117/68 | HR 114 | Resp 20 | Ht 63.0 in | Wt 182.0 lb

## 2012-11-21 DIAGNOSIS — R918 Other nonspecific abnormal finding of lung field: Secondary | ICD-10-CM

## 2012-11-21 DIAGNOSIS — C3490 Malignant neoplasm of unspecified part of unspecified bronchus or lung: Secondary | ICD-10-CM

## 2012-11-21 DIAGNOSIS — Z09 Encounter for follow-up examination after completed treatment for conditions other than malignant neoplasm: Secondary | ICD-10-CM

## 2012-11-21 DIAGNOSIS — Z902 Acquired absence of lung [part of]: Secondary | ICD-10-CM

## 2012-11-21 DIAGNOSIS — Z9889 Other specified postprocedural states: Secondary | ICD-10-CM

## 2012-11-21 DIAGNOSIS — C3491 Malignant neoplasm of unspecified part of right bronchus or lung: Secondary | ICD-10-CM

## 2012-11-21 NOTE — Progress Notes (Signed)
HPI:  Mrs. Bal today for a scheduled postoperative followup visit. 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 Her postoperative course was uncomplicated.  Since discharge he has been doing well. She is taking one tramadol tablet in the morning and 2 at night, but says she's having minimal discomfort. She is anxious to resume full activities. She's not had any difficulty breathing. For some reason it is not quite clear she was unable to get an inhaler but says she has not had any wheezing since surgery.  Past Medical History  Diagnosis Date  . Hyperlipidemia     mixed  . Gout   . Hypertension     benign  . Insomnia   . Vertigo   . Lung mass CT CHEST 10/17/2012    right middle lobe  . Lung nodules CT CHEST 10/17/2012    BOTH LUNGS  . SOB (shortness of breath)   . PONV (postoperative nausea and vomiting)   . Dysrhythmia     heart beating fast (holt. monitor)  . Depression   . COPD (chronic obstructive pulmonary disease)   . Diabetes mellitus without complication   . Type II or unspecified type diabetes mellitus with neurological manifestations, not stated as uncontrolled(250.60)     arthropathy  . History of kidney stones   . Arthritis     osteo  . CKD (chronic kidney disease)     Cr 3.9 10/06/12 ([facility])     Current Outpatient Prescriptions  Medication Sig Dispense Refill  . alendronate (FOSAMAX) 70 MG tablet Take 70 mg by mouth every 7 (seven) days.       Marland Kitchen amitriptyline (ELAVIL) 100 MG tablet Take 100 mg by mouth at bedtime.       Marland Kitchen amLODipine (NORVASC) 10 MG tablet Take 1 tablet (10 mg total) by mouth daily.  30 tablet  1  . aspirin 81 MG tablet Take 81 mg by mouth daily.      Marland Kitchen atorvastatin (LIPITOR) 40 MG tablet Take 40 mg by mouth at bedtime.       . budesonide-formoterol (SYMBICORT) 160-4.5 MCG/ACT inhaler Inhale 2 puffs into the lungs  2 (two) times daily.      . Cholecalciferol (VITAMIN D PO) Take 1 tablet by mouth at bedtime.      . furosemide (LASIX) 40 MG tablet Take 60 mg by mouth daily. TAKES 1 AND 1/2 TABS DAILY      . insulin NPH-regular (NOVOLIN 70/30) (70-30) 100 UNIT/ML injection Inject 22-47 Units into the skin 2 (two) times daily with a meal. 47 units in the am, and 22 units before dinner.      . latanoprost (XALATAN) 0.005 % ophthalmic solution Place 1 drop into both eyes at bedtime.      Marland Kitchen linagliptin (TRADJENTA) 5 MG TABS tablet Take 5 mg by mouth daily.      . traMADol (ULTRAM) 50 MG tablet Take 50 mg by mouth every 8 (eight) hours as needed.       Marland Kitchen ULORIC 40 MG tablet Take 40 mg by mouth daily.       . vitamin B-12 (CYANOCOBALAMIN) 1000 MCG tablet Take 1,000 mcg by mouth daily.       No current facility-administered medications for this visit.    Physical Exam BP 117/68  Pulse 114  Resp 20  Ht 5\' 3"  (1.6 m)  Wt 182 lb (82.555 kg)  BMI 32.25 kg/m2  SpO25 60% 72 year old woman in no acute distress Alert and oriented Lungs diminished the right base, otherwise clear, no wheezing Cardiac regular rate and rhythm normal S1 and S2 Incision and chest tube sites healing well  Diagnostic Tests: Chest x-ray 11/21/2012 CHEST - 2 VIEW  Comparison: 11/05/2012.  Findings: Increased right pleural effusion. Increased right base  volume loss. Left lung clear. Heart size normal. No pneumothorax.  Normal osseous structures.  IMPRESSION:  Worsening aeration. Increased right base volume loss and  increasing right pleural effusion.  Original Report Authenticated By: Davonna Belling, M.D.   Impression: 72 year old woman who is now 3 weeks out from a right middle lobectomy for what turned out to be a stage IIA carcinoid tumor. She is doing well clinically. She does have a small right effusion, but that makes the fluid filling the space formerly occupied by the middle lobe. She is not having any wheezing so do not think  she needs to be on inhaler.  At this point her activities are unrestricted identifies her to build into new activities gradually. She may begin driving on a limited basis. She was cautioned to use extra care as her reaction time he not be back to normal.  Although this was a carcinoid tumor she did have a positive node and should have adjuvant chemotherapy. She wishes to have this done in [facility]. We will see if we can get her an appointment with Dr. Jeanie Sewer.  Plan: Referred oncology for adjuvant chemotherapy  Return in 2 months with PA and lateral chest x-ray to

## 2013-01-19 ENCOUNTER — Other Ambulatory Visit: Payer: Self-pay | Admitting: *Deleted

## 2013-01-19 DIAGNOSIS — R918 Other nonspecific abnormal finding of lung field: Secondary | ICD-10-CM

## 2013-01-23 ENCOUNTER — Ambulatory Visit
Admission: RE | Admit: 2013-01-23 | Discharge: 2013-01-23 | Disposition: A | Discharge status: Home / Self Care | Payer: Medicare Other | Source: Ambulatory Visit | Attending: Thoracic Surgery (Cardiothoracic Vascular Surgery) | Admitting: Thoracic Surgery (Cardiothoracic Vascular Surgery)

## 2013-01-23 ENCOUNTER — Ambulatory Visit (INDEPENDENT_AMBULATORY_CARE_PROVIDER_SITE_OTHER): Payer: Medicare Other | Admitting: Thoracic Surgery (Cardiothoracic Vascular Surgery)

## 2013-01-23 ENCOUNTER — Encounter: Payer: Self-pay | Admitting: Thoracic Surgery (Cardiothoracic Vascular Surgery)

## 2013-01-23 VITALS — BP 127/76 | HR 98 | Resp 20 | Ht 63.0 in | Wt 182.0 lb

## 2013-01-23 DIAGNOSIS — D3A090 Benign carcinoid tumor of the bronchus and lung: Secondary | ICD-10-CM

## 2013-01-23 DIAGNOSIS — R918 Other nonspecific abnormal finding of lung field: Secondary | ICD-10-CM

## 2013-01-23 DIAGNOSIS — Z09 Encounter for follow-up examination after completed treatment for conditions other than malignant neoplasm: Secondary | ICD-10-CM

## 2013-01-23 DIAGNOSIS — Z902 Acquired absence of lung [part of]: Secondary | ICD-10-CM

## 2013-01-23 DIAGNOSIS — Z9889 Other specified postprocedural states: Secondary | ICD-10-CM

## 2013-01-23 NOTE — Progress Notes (Signed)
HPI:  Mindy Martinez returns today for a scheduled followup visit. She is a 72 year old woman who had a right middle lobectomy for a stage IIa carcinoid tumor on September 3. She saw oncologist in [facility] and it was elected to follow her rub adjuvant chemotherapy. She was last seen in the office on September 23 which time she was doing well.  She says that since that visit she has continued to do well. She has a normal appetite. She has not had any major breathing difficulties. She was started on Symbicort and says that has helped her breathing significantly. She doesn't have any pain related to her surgery. Her weight has been stable.  Past Medical History  Diagnosis Date  . Hyperlipidemia     mixed  . Gout   . Hypertension     benign  . Insomnia   . Vertigo   . Lung mass CT CHEST 10/17/2012    right middle lobe  . Lung nodules CT CHEST 10/17/2012    BOTH LUNGS  . SOB (shortness of breath)   . PONV (postoperative nausea and vomiting)   . Dysrhythmia     heart beating fast (holt. monitor)  . Depression   . COPD (chronic obstructive pulmonary disease)   . Diabetes mellitus without complication   . Type II or unspecified type diabetes mellitus with neurological manifestations, not stated as uncontrolled(250.60)     arthropathy  . History of kidney stones   . Arthritis     osteo  . CKD (chronic kidney disease)     Cr 3.9 10/06/12 ([facility])     Current Outpatient Prescriptions  Medication Sig Dispense Refill  . alendronate (FOSAMAX) 70 MG tablet Take 70 mg by mouth every 7 (seven) days.       Marland Kitchen amitriptyline (ELAVIL) 100 MG tablet Take 100 mg by mouth at bedtime.       Marland Kitchen amLODipine (NORVASC) 10 MG tablet Take 1 tablet (10 mg total) by mouth daily.  30 tablet  1  . aspirin 81 MG tablet Take 81 mg by mouth daily.      Marland Kitchen atorvastatin (LIPITOR) 40 MG tablet Take 40 mg by mouth at bedtime.       . budesonide-formoterol (SYMBICORT) 160-4.5 MCG/ACT inhaler Inhale 2 puffs into  the lungs 2 (two) times daily.      . Cholecalciferol (VITAMIN D PO) Take 1 tablet by mouth at bedtime.      . furosemide (LASIX) 40 MG tablet Take 60 mg by mouth daily. TAKES 1 AND 1/2 TABS DAILY      . hydrocortisone cream 1 % Apply 1 application topically 2 (two) times daily.      . insulin NPH-regular (NOVOLIN 70/30) (70-30) 100 UNIT/ML injection Inject 22-47 Units into the skin 2 (two) times daily with a meal. 47 units in the am, and 22 units before dinner.      . latanoprost (XALATAN) 0.005 % ophthalmic solution Place 1 drop into both eyes at bedtime.      Marland Kitchen linagliptin (TRADJENTA) 5 MG TABS tablet Take 5 mg by mouth daily.      . traMADol (ULTRAM) 50 MG tablet Take 50 mg by mouth every 8 (eight) hours as needed.       Marland Kitchen ULORIC 40 MG tablet Take 40 mg by mouth daily.       . vitamin B-12 (CYANOCOBALAMIN) 1000 MCG tablet Take 1,000 mcg by mouth daily.       No current facility-administered  medications for this visit.    Physical Exam BP 127/76  Pulse 98  Resp 20  Ht 5\' 3"  (1.6 m)  Wt 182 lb (82.555 kg)  BMI 32.25 kg/m2  SpO32 58% 72 year old woman in no acute distress Neuro alert and oriented x3 with no focal deficits Cardiac regular rate and rhythm 2/6 systolic murmur Lungs diminished the right base otherwise clear Incisions well healed No cervical or supraclavicular adenopathy  Diagnostic Tests: Chest x-ray on 2514 FINDINGS:  The small right effusion has diminished, with postoperative  atelectasis remaining at the right lung base. The left lung is  clear. Heart size is stable. Old healed left anterior lower rib  fractures are again noted.  IMPRESSION:  Decrease in small right pleural effusion. Stable postop change at  the right lung base.  Impression: 72 year old woman who is now almost 3 months out from a right middle lobectomy for a stage IIA carcinoid tumor. She has no evidence recurrent disease. She does have some persistent right basilar atelectasis/scarring from  her surgery. The small associated pleural effusion has decreased in size since her last x-ray.  She is scheduled to have a CT scan on December 29. I will be happy to see her back at that time if there are any questions about the scan that I can answer.   Plan: Return in 4 months with PA and lateral chest x-ray

## 2013-05-10 ENCOUNTER — Other Ambulatory Visit: Payer: Self-pay | Admitting: *Deleted

## 2013-05-10 DIAGNOSIS — J90 Pleural effusion, not elsewhere classified: Secondary | ICD-10-CM

## 2013-05-22 ENCOUNTER — Ambulatory Visit
Admission: RE | Admit: 2013-05-22 | Discharge: 2013-05-22 | Disposition: A | Discharge status: Home / Self Care | Payer: Medicare Other | Source: Ambulatory Visit | Attending: Thoracic Surgery (Cardiothoracic Vascular Surgery) | Admitting: Thoracic Surgery (Cardiothoracic Vascular Surgery)

## 2013-05-22 ENCOUNTER — Ambulatory Visit (INDEPENDENT_AMBULATORY_CARE_PROVIDER_SITE_OTHER): Payer: Medicare Other | Admitting: Thoracic Surgery (Cardiothoracic Vascular Surgery)

## 2013-05-22 ENCOUNTER — Encounter: Payer: Self-pay | Admitting: Thoracic Surgery (Cardiothoracic Vascular Surgery)

## 2013-05-22 VITALS — BP 110/64 | HR 100 | Resp 20 | Ht 63.0 in | Wt 182.0 lb

## 2013-05-22 DIAGNOSIS — J90 Pleural effusion, not elsewhere classified: Secondary | ICD-10-CM

## 2013-05-22 DIAGNOSIS — D3A090 Benign carcinoid tumor of the bronchus and lung: Secondary | ICD-10-CM

## 2013-05-22 DIAGNOSIS — Z902 Acquired absence of lung [part of]: Secondary | ICD-10-CM

## 2013-05-22 DIAGNOSIS — Z9889 Other specified postprocedural states: Secondary | ICD-10-CM

## 2013-05-22 NOTE — Progress Notes (Signed)
HPI:  Mindy Martinez returns today for a scheduled followup. She is a 73 year old woman who underwent a right middle lobectomy and node dissection for a stage II a carcinoid tumor back in September of 2014. She was last seen in the office in late November. At that time she was doing well. She had a CT scan in December which showed no evidence of recurrent disease.  She says that in the interim since her last visit she's been doing well from a respiratory standpoint. She has not had any bronchitis or pneumonia over the winter. She denies wheezing. She has an occasional cough but nothing out of the ordinary. She denies any incisional pain. Her weight has been stable.  Past Medical History  Diagnosis Date  . Hyperlipidemia     mixed  . Gout   . Hypertension     benign  . Insomnia   . Vertigo   . Lung mass CT CHEST 10/17/2012    right middle lobe  . Lung nodules CT CHEST 10/17/2012    BOTH LUNGS  . SOB (shortness of breath)   . PONV (postoperative nausea and vomiting)   . Dysrhythmia     heart beating fast (holt. monitor)  . Depression   . COPD (chronic obstructive pulmonary disease)   . Diabetes mellitus without complication   . Type II or unspecified type diabetes mellitus with neurological manifestations, not stated as uncontrolled     arthropathy  . History of kidney stones   . Arthritis     osteo  . CKD (chronic kidney disease)     Cr 3.9 10/06/12 ([place] [facility])  . Carcinoid tumor of lung resected 11/01/2012    Stage IIA    Social history Quit smoking in 2007 Applied for disability, outcome pending  Current Outpatient Prescriptions  Medication Sig Dispense Refill  . alendronate (FOSAMAX) 70 MG tablet Take 70 mg by mouth every 7 (seven) days.       Marland Kitchen amitriptyline (ELAVIL) 100 MG tablet Take 100 mg by mouth at bedtime.       Marland Kitchen amLODipine (NORVASC) 10 MG tablet Take 1 tablet (10 mg total) by mouth daily.  30 tablet  1  . aspirin 81 MG tablet Take 81 mg by mouth daily.      Marland Kitchen  atorvastatin (LIPITOR) 40 MG tablet Take 40 mg by mouth at bedtime.       . budesonide-formoterol (SYMBICORT) 160-4.5 MCG/ACT inhaler Inhale 2 puffs into the lungs 2 (two) times daily.      . Cholecalciferol (VITAMIN D PO) Take 1 tablet by mouth at bedtime.      . furosemide (LASIX) 40 MG tablet Take 60 mg by mouth daily. TAKES 1 AND 1/2 TABS DAILY      . hydrocortisone cream 1 % Apply 1 application topically 2 (two) times daily.      . insulin NPH-regular (NOVOLIN 70/30) (70-30) 100 UNIT/ML injection Inject 22-47 Units into the skin 2 (two) times daily with a meal. 41 units in the am, and 19 units before dinner.      . latanoprost (XALATAN) 0.005 % ophthalmic solution Place 1 drop into both eyes at bedtime.      Marland Kitchen linagliptin (TRADJENTA) 5 MG TABS tablet Take 5 mg by mouth daily.      . traMADol (ULTRAM) 50 MG tablet Take 50 mg by mouth every 8 (eight) hours as needed.       Marland Kitchen ULORIC 40 MG tablet Take 40 mg by mouth  daily.       . vitamin B-12 (CYANOCOBALAMIN) 1000 MCG tablet Take 1,000 mcg by mouth daily.       No current facility-administered medications for this visit.    Physical Exam BP 110/64  Pulse 100  Resp 20  Ht 5\' 3"  (1.6 m)  Wt 182 lb (82.555 kg)  BMI 32.25 kg/m2  SpO54 47% 73 year old woman in no acute distress Overweight HEENT unremarkable Lungs clear with equal breath sounds bilaterally Cardiac regular rate and rhythm normal S1 and S2 Right chest incisions well healed No cervical or suprapubic or adenopathy  Diagnostic Tests: Chest x-ray 05/22/2013 CHEST 2 VIEW  COMPARISON: Chest x-ray of 01/23/2013 and CT chest of 02/26/2013  FINDINGS:  Aeration of the right lung base has improved slightly. A small  pleural effusion remains at the right lung base possibly loculated  with volume loss at the right lung base and linear scarring in the  right mid lung. The left lung is clear. Heart size is stable.  IMPRESSION:  Stable pleural and parenchymal opacity primarily at  the right lung  base postoperatively with probable small right effusion, possibly  loculated.  Electronically Signed  By: Ivar Drape M.D.  On: 05/22/2013 10:00   Impression: 73 year old woman who had a right middle lobectomy for stage II a carcinoid tumor in September of 2014. She is doing well with no evidence of recurrent disease at the present time.  She did see an oncologist in [facility] but cannot remember his name. No adjuvant therapy was recommended.  Plan: Return in 6 months with CT chest for one year followup visit.

## 2013-07-30 DIAGNOSIS — C801 Malignant (primary) neoplasm, unspecified: Secondary | ICD-10-CM

## 2013-07-30 HISTORY — DX: Malignant (primary) neoplasm, unspecified: C80.1

## 2013-10-09 ENCOUNTER — Other Ambulatory Visit: Payer: Self-pay | Admitting: *Deleted

## 2013-10-09 DIAGNOSIS — D1431 Benign neoplasm of right bronchus and lung: Secondary | ICD-10-CM

## 2013-11-20 ENCOUNTER — Ambulatory Visit (INDEPENDENT_AMBULATORY_CARE_PROVIDER_SITE_OTHER): Payer: Medicare Other | Admitting: Thoracic Surgery (Cardiothoracic Vascular Surgery)

## 2013-11-20 ENCOUNTER — Ambulatory Visit
Admission: RE | Admit: 2013-11-20 | Discharge: 2013-11-20 | Disposition: A | Discharge status: Home / Self Care | Payer: Medicare Other | Source: Ambulatory Visit | Attending: Thoracic Surgery (Cardiothoracic Vascular Surgery) | Admitting: Thoracic Surgery (Cardiothoracic Vascular Surgery)

## 2013-11-20 ENCOUNTER — Encounter: Payer: Self-pay | Admitting: Thoracic Surgery (Cardiothoracic Vascular Surgery)

## 2013-11-20 VITALS — BP 103/60 | HR 92 | Resp 20 | Ht 63.0 in | Wt 182.0 lb

## 2013-11-20 DIAGNOSIS — C519 Malignant neoplasm of vulva, unspecified: Secondary | ICD-10-CM | POA: Insufficient documentation

## 2013-11-20 DIAGNOSIS — D1431 Benign neoplasm of right bronchus and lung: Secondary | ICD-10-CM

## 2013-11-20 DIAGNOSIS — D3A090 Benign carcinoid tumor of the bronchus and lung: Secondary | ICD-10-CM

## 2013-11-20 NOTE — Progress Notes (Signed)
HPI:  Mindy Martinez returns today for a 1 year followup visit.  She had a thoracoscopic right middle lobectomy and node dissection for a carcinoid tumor back in September of 2014. She did have a + 10R node so with stage IIA. She saw an oncologist but did not have any adjuvant chemotherapy.  She was last in the office in March at which time she was doing well with no evidence recurrent disease.  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 She had a resection followed by radiation therapy which she recently completed.  She's not had any problems with her breathing. She is having some issues related to her radiation therapy.  Past Medical History  Diagnosis Date  . Hyperlipidemia     mixed  . Gout   . Hypertension     benign  . Insomnia   . Vertigo   . Lung mass CT CHEST 10/17/2012    right middle lobe  . Lung nodules CT CHEST 10/17/2012    BOTH LUNGS  . SOB (shortness of breath)   . PONV (postoperative nausea and vomiting)   . Dysrhythmia     heart beating fast (holt. monitor)  . Depression   . COPD (chronic obstructive pulmonary disease)   . Diabetes mellitus without complication   . Type II or unspecified type diabetes mellitus with neurological manifestations, not stated as uncontrolled     arthropathy  . History of kidney stones   . Arthritis     osteo  . CKD (chronic kidney disease)     Cr 3.9 10/06/12 ([employer])  . Carcinoid tumor of lung resected 11/01/2012    Stage IIA  . Cancer 07/2013    vulvar cancer- resection and XRT      Current Outpatient Prescriptions  Medication Sig Dispense Refill  . alendronate (FOSAMAX) 70 MG tablet Take 70 mg by mouth every 7 (seven) days.       Marland Kitchen amitriptyline (ELAVIL) 100 MG tablet Take 100 mg by mouth at bedtime.       Marland Kitchen amLODipine (NORVASC) 10 MG tablet Take 1 tablet (10 mg total) by mouth daily.  30 tablet  1  . aspirin 81 MG tablet Take 81 mg by mouth daily.      Marland Kitchen atorvastatin  (LIPITOR) 40 MG tablet Take 40 mg by mouth at bedtime.       . budesonide-formoterol (SYMBICORT) 160-4.5 MCG/ACT inhaler Inhale 2 puffs into the lungs 2 (two) times daily.      . Cholecalciferol (VITAMIN D PO) Take 1 tablet by mouth at bedtime.      . furosemide (LASIX) 40 MG tablet Take 60 mg by mouth daily. TAKES 1 AND 1/2 TABS DAILY      . hydrocortisone cream 1 % Apply 1 application topically 2 (two) times daily.      . insulin NPH-regular (NOVOLIN 70/30) (70-30) 100 UNIT/ML injection Inject 22-47 Units into the skin 2 (two) times daily with a meal. 41 units in the am, and 19 units before dinner.      . latanoprost (XALATAN) 0.005 % ophthalmic solution Place 1 drop into both eyes at bedtime.      . traMADol (ULTRAM) 50 MG tablet Take 50 mg by mouth every 8 (eight) hours as needed.       Marland Kitchen ULORIC 40 MG tablet Take 40 mg by mouth daily.       . vitamin B-12 (CYANOCOBALAMIN) 1000 MCG tablet Take  1,000 mcg by mouth daily.       No current facility-administered medications for this visit.    Physical Exam BP 103/60  Pulse 92  Resp 20  Ht 5\' 3"  (1.6 m)  Wt 182 lb (82.555 kg)  BMI 32.25 kg/m2  SpO24 24% 73 year old woman in no acute distress No cervical or suprapubic or adenopathy Lungs clear with equal breath sounds bilaterally Cardiac regular rate and rhythm Incisions well healed  Diagnostic Tests: CT CHEST WITHOUT CONTRAST  TECHNIQUE:  Multidetector CT imaging of the chest was performed following the  standard protocol without IV contrast.  COMPARISON: 06/26/2013 PET. Chest CT 02/26/2013. Clinic note of  05/22/2013.  FINDINGS:  Lungs/Pleura: Right middle lobectomy and node dissection. Minimal  nodularity lateral to the surgical sutures on image 26 is similar to  on image 25 of the prior exam. Mild scarring at the right lung base  laterally.  3 mm left upper lobe lung nodule on image 29 is similar to on image  26 the prior exam.  A left upper lobe calcified granuloma on image  18.  Decrease in trace right pleural fluid.  Heart/Mediastinum: No supraclavicular adenopathy. Bilateral  low-density thyroid nodules which are nonspecific. Aortic and branch  vessel atherosclerosis. Mild cardiomegaly, without pericardial  effusion. Pulmonary artery enlargement, with the outflow tract  measuring 3.3 cm. Mediastinal node dissection. No middle mediastinal  or definite hilar adenopathy, given limitation of unenhanced CT.  9 mm prevascular node measures similar to on the prior exam. This  was similar back to 10/17/12.  Upper Abdomen: Cholecystectomy. Normal imaged portions of the liver,  spleen, stomach, pancreas, adrenal glands. Left renal atrophy.  Chronic bilateral perinephric interstitial thickening, nonspecific.  Bones/Musculoskeletal: Remote left anterior fifth and sixth rib  trauma.  IMPRESSION:  1. Status post right middle lobectomy and mediastinal lymph node  dissection. No evidence of recurrent or metastatic disease.  2. Borderline enlarged prevascular node, similar back to 10/17/2012.  Favored to be reactive.  3. Pulmonary artery enlargement suggests pulmonary arterial  hypertension.  4. Atherosclerosis, including within the coronary arteries.  5. Decrease in trace right pleural fluid.  6. Small bilateral pulmonary nodules which are not significantly  changed. Recommend attention on follow-up.  Electronically Signed  By: Abigail Miyamoto M.D.  On: 11/20/2013 11:50   Impression: 73 year old woman with history of stage II a carcinoid resected with a right lobectomy a year ago. She has no evidence recurrent disease at this time.  She is currently being treated for vulvar carcinoma.  Given that she did have positive nodes we need to continue to follow her with CT scans at 6 months for the next year. If there is no evidence recurrent disease at that time we can get annual scans after that.  Plan: Return in 6 months with CT of chest

## 2014-04-10 ENCOUNTER — Other Ambulatory Visit: Payer: Self-pay | Admitting: *Deleted

## 2014-04-10 DIAGNOSIS — D1431 Benign neoplasm of right bronchus and lung: Secondary | ICD-10-CM

## 2014-05-16 ENCOUNTER — Encounter: Payer: Self-pay | Admitting: *Deleted

## 2014-05-21 ENCOUNTER — Other Ambulatory Visit: Payer: Self-pay

## 2014-05-21 ENCOUNTER — Ambulatory Visit: Payer: Medicaid Other | Admitting: Thoracic Surgery (Cardiothoracic Vascular Surgery)

## 2014-06-04 ENCOUNTER — Ambulatory Visit
Admission: RE | Admit: 2014-06-04 | Discharge: 2014-06-04 | Disposition: A | Discharge status: Home / Self Care | Payer: Medicare Other | Source: Ambulatory Visit | Attending: Thoracic Surgery (Cardiothoracic Vascular Surgery) | Admitting: Thoracic Surgery (Cardiothoracic Vascular Surgery)

## 2014-06-04 ENCOUNTER — Encounter: Payer: Medicare Other | Admitting: Thoracic Surgery (Cardiothoracic Vascular Surgery)

## 2014-06-04 VITALS — Ht 63.0 in

## 2014-06-04 DIAGNOSIS — D1431 Benign neoplasm of right bronchus and lung: Secondary | ICD-10-CM

## 2014-06-04 DIAGNOSIS — D3A090 Benign carcinoid tumor of the bronchus and lung: Secondary | ICD-10-CM

## 2014-06-04 DIAGNOSIS — Z902 Acquired absence of lung [part of]: Secondary | ICD-10-CM

## 2014-06-04 NOTE — Progress Notes (Signed)
This encounter was created in error - please disregard.

## 2014-12-06 ENCOUNTER — Other Ambulatory Visit: Payer: Self-pay | Admitting: Thoracic Surgery (Cardiothoracic Vascular Surgery)

## 2014-12-06 DIAGNOSIS — D1430 Benign neoplasm of unspecified bronchus and lung: Secondary | ICD-10-CM

## 2014-12-24 ENCOUNTER — Encounter: Payer: Self-pay | Admitting: Thoracic Surgery (Cardiothoracic Vascular Surgery)

## 2014-12-24 ENCOUNTER — Ambulatory Visit (INDEPENDENT_AMBULATORY_CARE_PROVIDER_SITE_OTHER): Payer: Medicare Other | Admitting: Thoracic Surgery (Cardiothoracic Vascular Surgery)

## 2014-12-24 ENCOUNTER — Ambulatory Visit
Admission: RE | Admit: 2014-12-24 | Discharge: 2014-12-24 | Disposition: A | Discharge status: Home / Self Care | Payer: Medicare Other | Source: Ambulatory Visit | Attending: Thoracic Surgery (Cardiothoracic Vascular Surgery) | Admitting: Thoracic Surgery (Cardiothoracic Vascular Surgery)

## 2014-12-24 VITALS — BP 137/67 | HR 67 | Resp 20 | Ht 63.0 in | Wt 152.0 lb

## 2014-12-24 DIAGNOSIS — D1431 Benign neoplasm of right bronchus and lung: Secondary | ICD-10-CM

## 2014-12-24 DIAGNOSIS — D1430 Benign neoplasm of unspecified bronchus and lung: Secondary | ICD-10-CM

## 2014-12-24 DIAGNOSIS — Z902 Acquired absence of lung [part of]: Secondary | ICD-10-CM

## 2014-12-24 DIAGNOSIS — D3A090 Benign carcinoid tumor of the bronchus and lung: Secondary | ICD-10-CM

## 2014-12-24 DIAGNOSIS — C519 Malignant neoplasm of vulva, unspecified: Secondary | ICD-10-CM

## 2014-12-24 NOTE — Progress Notes (Signed)
[address]       [place] [address]             [phone]       HPI: Mindy Martinez returns for a scheduled 2 year follow-up visit.  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 We have been following her at six-month intervals since that time. She has had no evidence of recurrent disease.  Since her last visit she recently had AV fistula done on her left arm in preparation for dialysis. She has not had to start dialysis yet, but was told she probably will within the next year. She has not had any problems with breathing. Her weight is stable. Her appetite is good. 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  Past Medical History  Diagnosis Date  . Hyperlipidemia     mixed  . Gout   . Hypertension     benign  . Insomnia   . Vertigo   . Lung mass CT CHEST 10/17/2012    right middle lobe  . Lung nodules CT CHEST 10/17/2012    BOTH LUNGS  . SOB (shortness of breath)   . PONV (postoperative nausea and vomiting)   . Dysrhythmia     heart beating fast (holt. monitor)  . Depression   . COPD (chronic obstructive pulmonary disease) ([place])   . Diabetes mellitus without complication ([place])   . Type II or unspecified type diabetes mellitus with neurological manifestations, not stated as uncontrolled     arthropathy  . History of kidney stones   . Arthritis     osteo  . CKD (chronic kidney disease)     Cr 3.9 10/06/12 ([place])  . Carcinoid tumor of lung resected 11/01/2012    Stage IIA  . Cancer ([place]) 07/2013    vulvar cancer- resection and XRT      Current Outpatient Prescriptions  Medication Sig Dispense Refill  . alendronate (FOSAMAX) 70 MG tablet Take 70 mg by mouth every 7 (seven) days.     Marland Kitchen amitriptyline (ELAVIL) 100 MG tablet Take 100 mg by mouth at bedtime.     Marland Kitchen amLODipine (NORVASC) 10 MG tablet Take 1 tablet (10 mg total)  by mouth daily. 30 tablet 1  . aspirin 81 MG tablet Take 81 mg by mouth daily.    Marland Kitchen atorvastatin (LIPITOR) 40 MG tablet Take 40 mg by mouth at bedtime.     . budesonide-formoterol (SYMBICORT) 160-4.5 MCG/ACT inhaler Inhale 2 puffs into the lungs 2 (two) times daily.    . Cholecalciferol (VITAMIN D PO) Take 1 tablet by mouth at bedtime.    . furosemide (LASIX) 40 MG tablet Take 60 mg by mouth daily. TAKES 1 AND 1/2 TABS DAILY    . hydrocortisone cream 1 % Apply 1 application topically 2 (two) times daily.    . insulin NPH-regular (NOVOLIN 70/30) (70-30) 100 UNIT/ML injection Inject 22-47 Units into the skin 2 (two) times daily with a meal. 41 units in the am, and 19 units before dinner.    . latanoprost (XALATAN) 0.005 % ophthalmic solution Place 1 drop into both eyes at bedtime.    . traMADol (ULTRAM) 50 MG tablet Take 50 mg by mouth every 8 (eight) hours as needed.     Marland Kitchen ULORIC 40 MG tablet Take 40 mg by mouth daily.     Marland Kitchen  vitamin B-12 (CYANOCOBALAMIN) 1000 MCG tablet Take 1,000 mcg by mouth daily.     No current facility-administered medications for this visit.    Physical Exam BP 137/67 mmHg  Pulse 67  Resp 20  Ht '5\' 3"'$  (1.6 m)  Wt 152 lb (68.947 kg)  BMI 26.93 kg/m2  SpO38 18% 74 year old woman appears older than stated age Alert and oriented 3 with no focal neurologic deficits No cervical or subclavicular adenopathy Lungs clear with equal breath sounds bilaterally Incisions well healed Cardiac regular rate and rhythm with a 2/6 systolic murmur faint diastolic murmur Incision left upper arm healing well  Diagnostic Tests: I personally reviewed her CT chest and concur with the findings as noted below CT CHEST WITHOUT CONTRAST  TECHNIQUE: Multidetector CT imaging of the chest was performed following the standard protocol without IV contrast.  COMPARISON: CT 06/04/2014 and 11/20/2013.  FINDINGS: Mediastinum/Nodes: There are no enlarged mediastinal, hilar or axillary  lymph nodes. Small retrosternal soft tissue nodule on image 22 is unchanged.Mediastinal and right hilar surgical clips are noted. Nodularity of the thyroid gland appears unchanged. The heart size is normal. There is no pericardial effusion. There is diffuse atherosclerosis of the aorta, great vessels and coronary arteries. Mitral annular calcifications are present.  Lungs/Pleura: A small right pleural effusion has mildly increased in volume. There is no left pleural effusion. There are stable postsurgical changes status post right middle lobe resection with mild residual scarring around the suture line. A right upper lobe nodule measures 4 mm on image 29 (previously 5 mm). 4 mm lingular nodule on image 29 is unchanged. There is a stable calcified left upper lobe granuloma. There are no new or enlarging pulmonary nodules.  Upper abdomen: The visualized upper abdomen appears unchanged status post cholecystectomy. There is no adrenal mass. Perinephric soft tissue stranding is present bilaterally, grossly stable.  Musculoskeletal/Chest wall: There is no chest wall mass or suspicious osseous finding. Stable bilateral rib deformity is attributed to prior thoracotomy and/or fractures.  IMPRESSION: 1. Minimal interval enlargement of small right pleural effusion. 2. Otherwise stable postoperative appearance of the chest status post right middle lobe resection. 3. Mild upper lobe nodularity appears unchanged. No evidence of metastatic disease. 4. Stable multinodular goiter and atherosclerosis.   Electronically Signed  By: Richardean Sale M.D.  On: 12/24/2014 12:21  Impression: 74 year old woman who is now 2 years out from a right middle lobectomy for stage II a carcinoid tumor. Because there were lymph node metastasis we need to continue to follow her. Her CT scan today is unchanged from her scan from 6 months ago. At this point I think we can safely go to annual follow-up  CT.  Plan: Return in one year with CT chest  I spent 10 minutes with Mindy Martinez during this visit, greater than 50% was spent in counseling  Mindy Nakayama, MD [facility] [phone]

## 2015-01-08 IMAGING — CR DG CHEST 2V
2 series · 2 of 2 positions shown · non-contrast
Comparison: None available

CLINICAL DATA: Right middle lobe mass, shortness of breath

CHEST - 2 VIEW

[w chest pa]
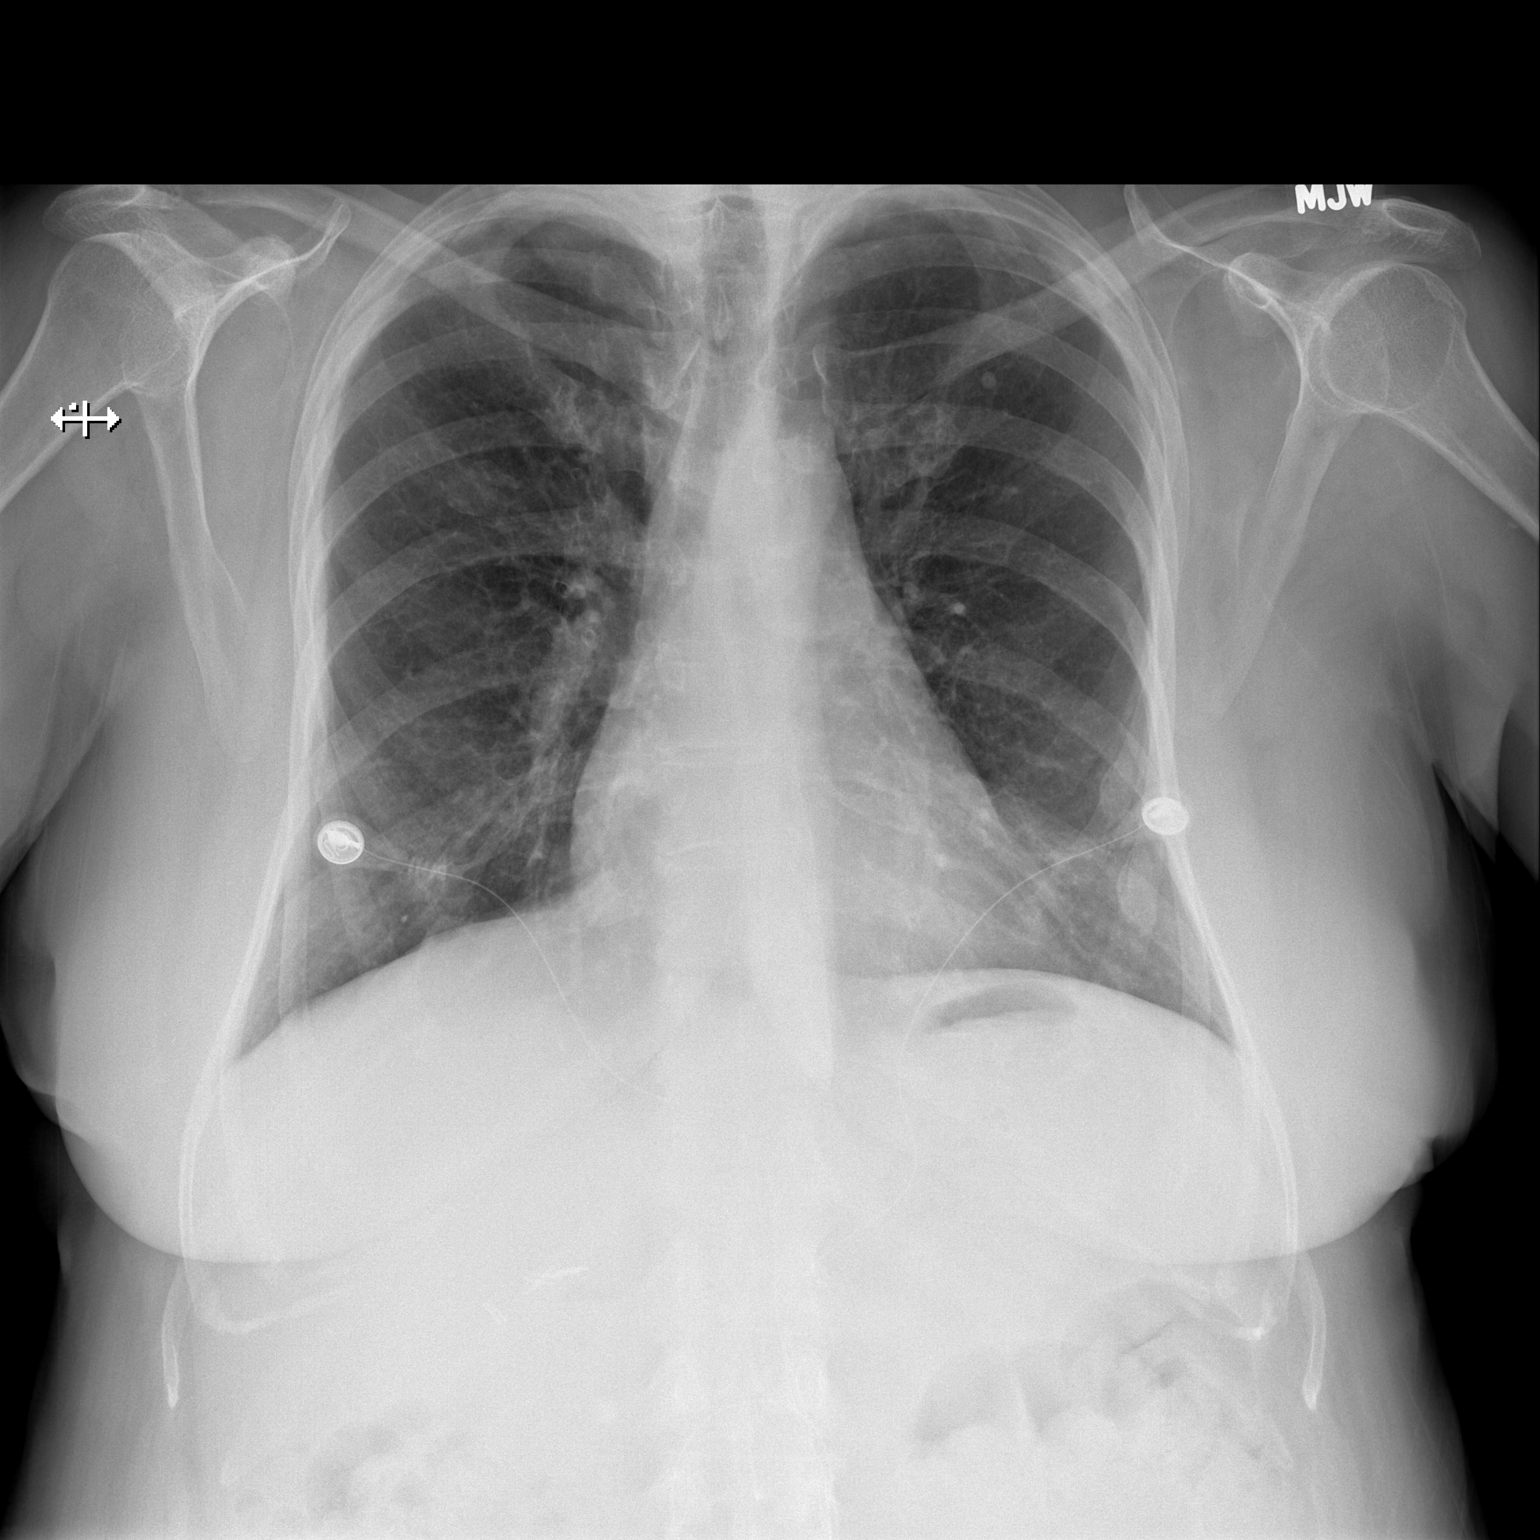

[w chest lat]
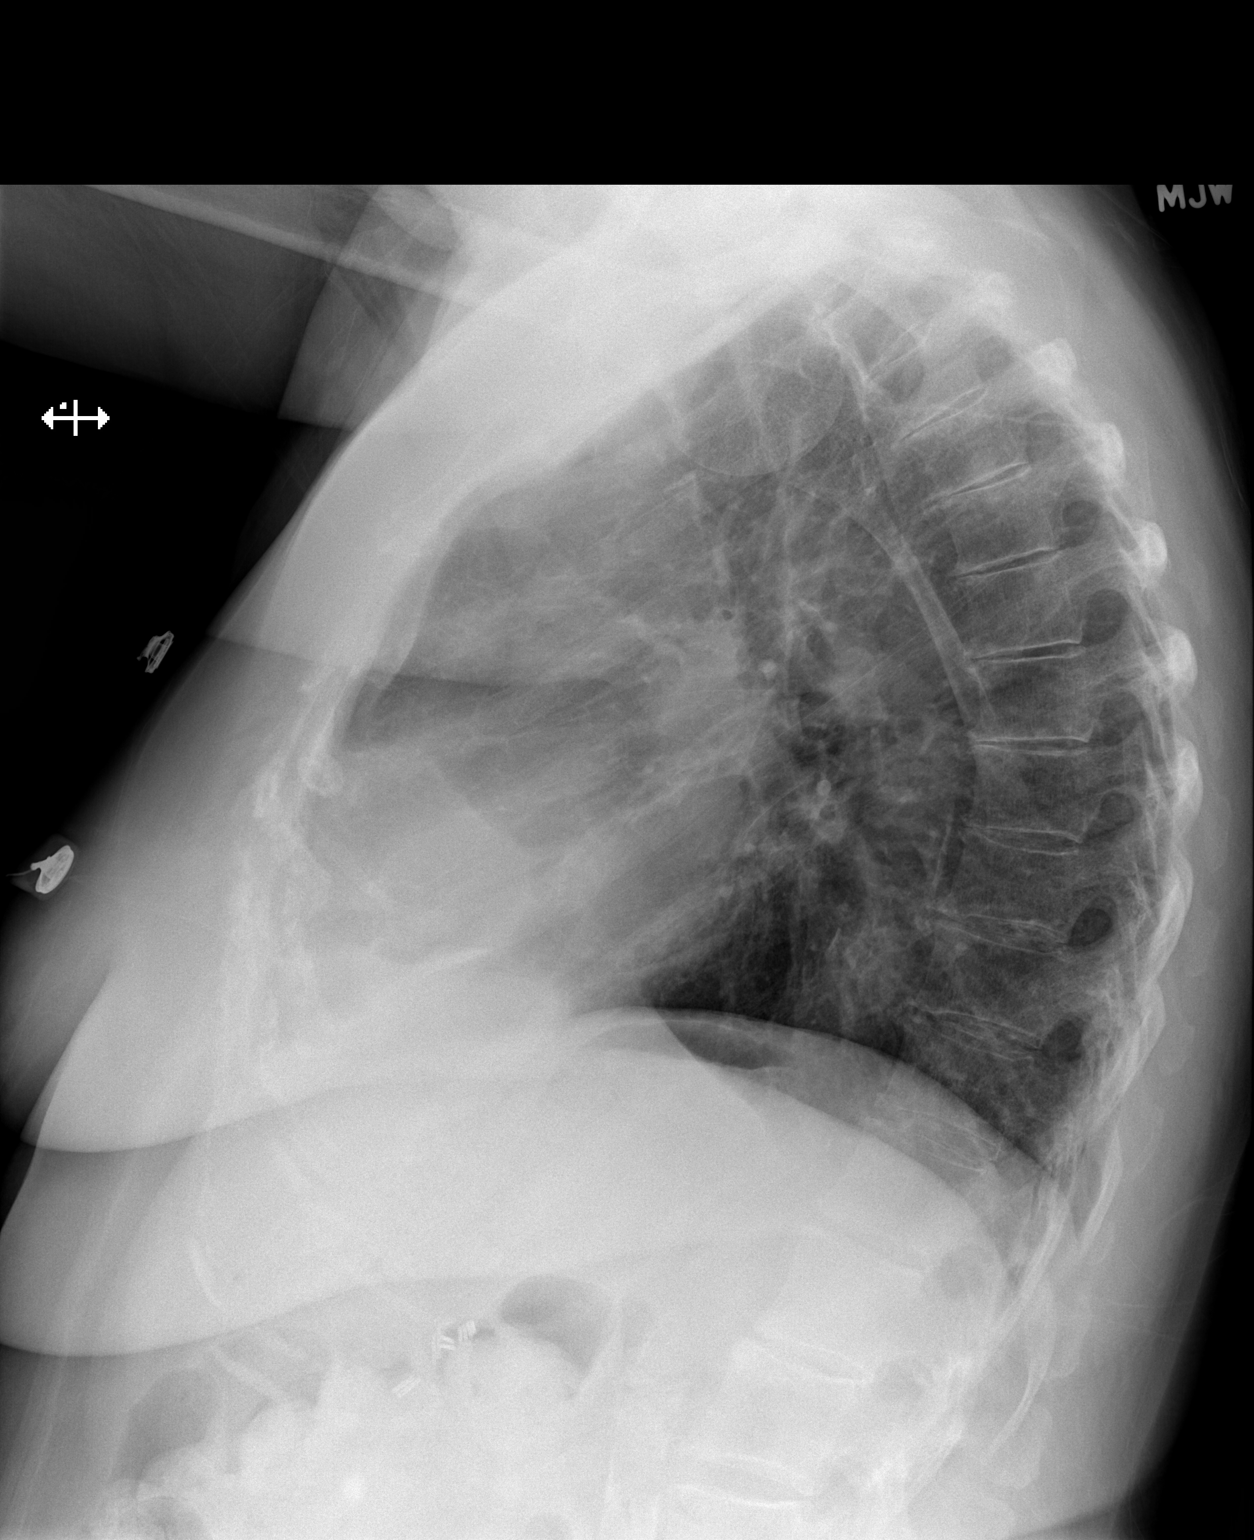

[2 of 2 positions shown; findings below may reference images not displayed]

FINDINGS: Normal heart size and vascularity.  Chronic bronchitic
changes centrally.  The apparent 1.7 cm right middle lobe mass is
not well demonstrated by plain radiography.  This is present on
outside chest CT which is not available for direct comparison.  No
superimposed pneumonia, collapse or consolidation.  No effusion or
pneumothorax.  Trachea midline.
IMPRESSION: No acute chest process.  Known 1.7 cm right middle lobe mass by CT
is not well demonstrated by plain radiography.

## 2015-01-10 IMAGING — CR DG CHEST 1V PORT
1 series · 1 of 1 positions shown · non-contrast
Comparison: 11/01/2012

CLINICAL DATA: Postop from resection of right lung mass.

EXAM:
PORTABLE CHEST - 1 VIEW

[AP]
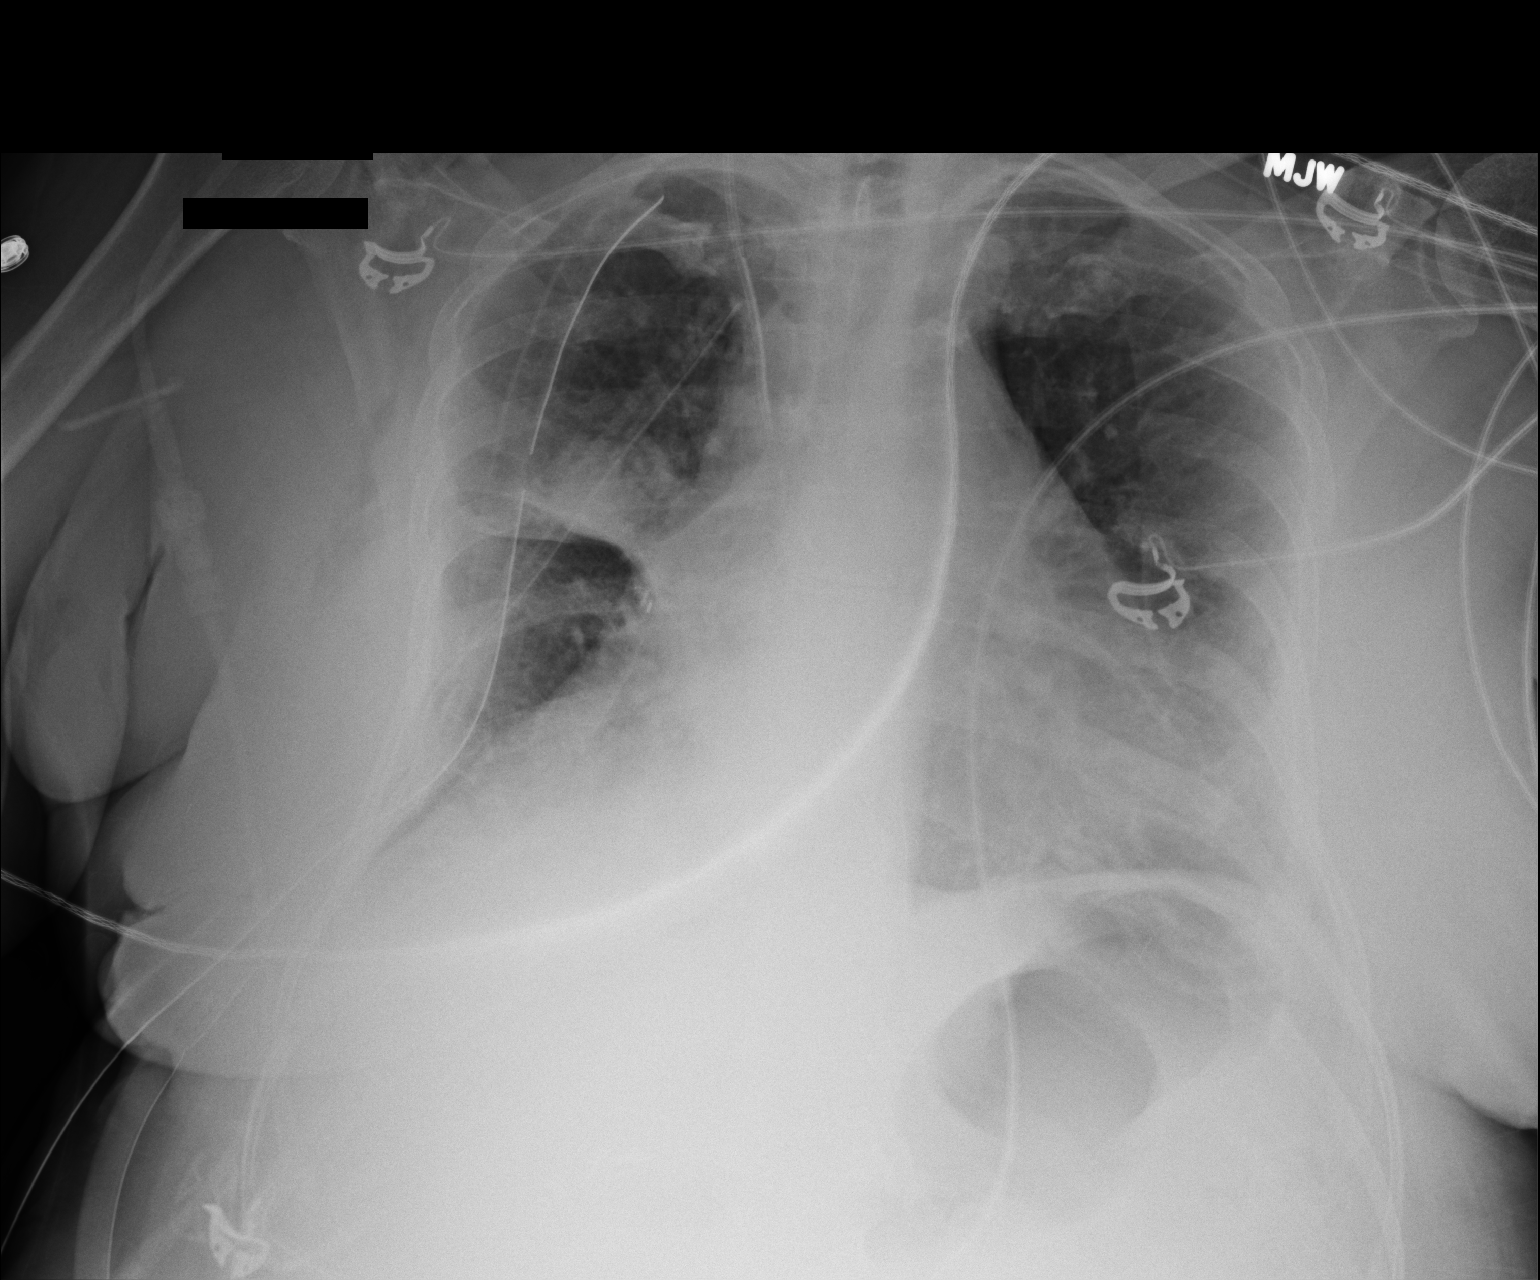

[1 of 1 positions shown; findings below may reference images not displayed]

FINDINGS: Two right chest tubes remain in place as well as right internal
jugular central venous catheter. No pneumothorax visualized.
Increased atelectasis is seen in the right perihilar region. Left
lung remains clear. Heart size is stable.
IMPRESSION: Increased right perihilar atelectasis. No pneumothorax visualized.

## 2015-01-11 IMAGING — DX DG CHEST 1V PORT
1 series · 1 of 1 positions shown · non-contrast
Comparison: Prior chest x-ray 11/02/2012

CLINICAL DATA: Status post VATS with chest tubes in place

PORTABLE CHEST - 1 VIEW

[portable]
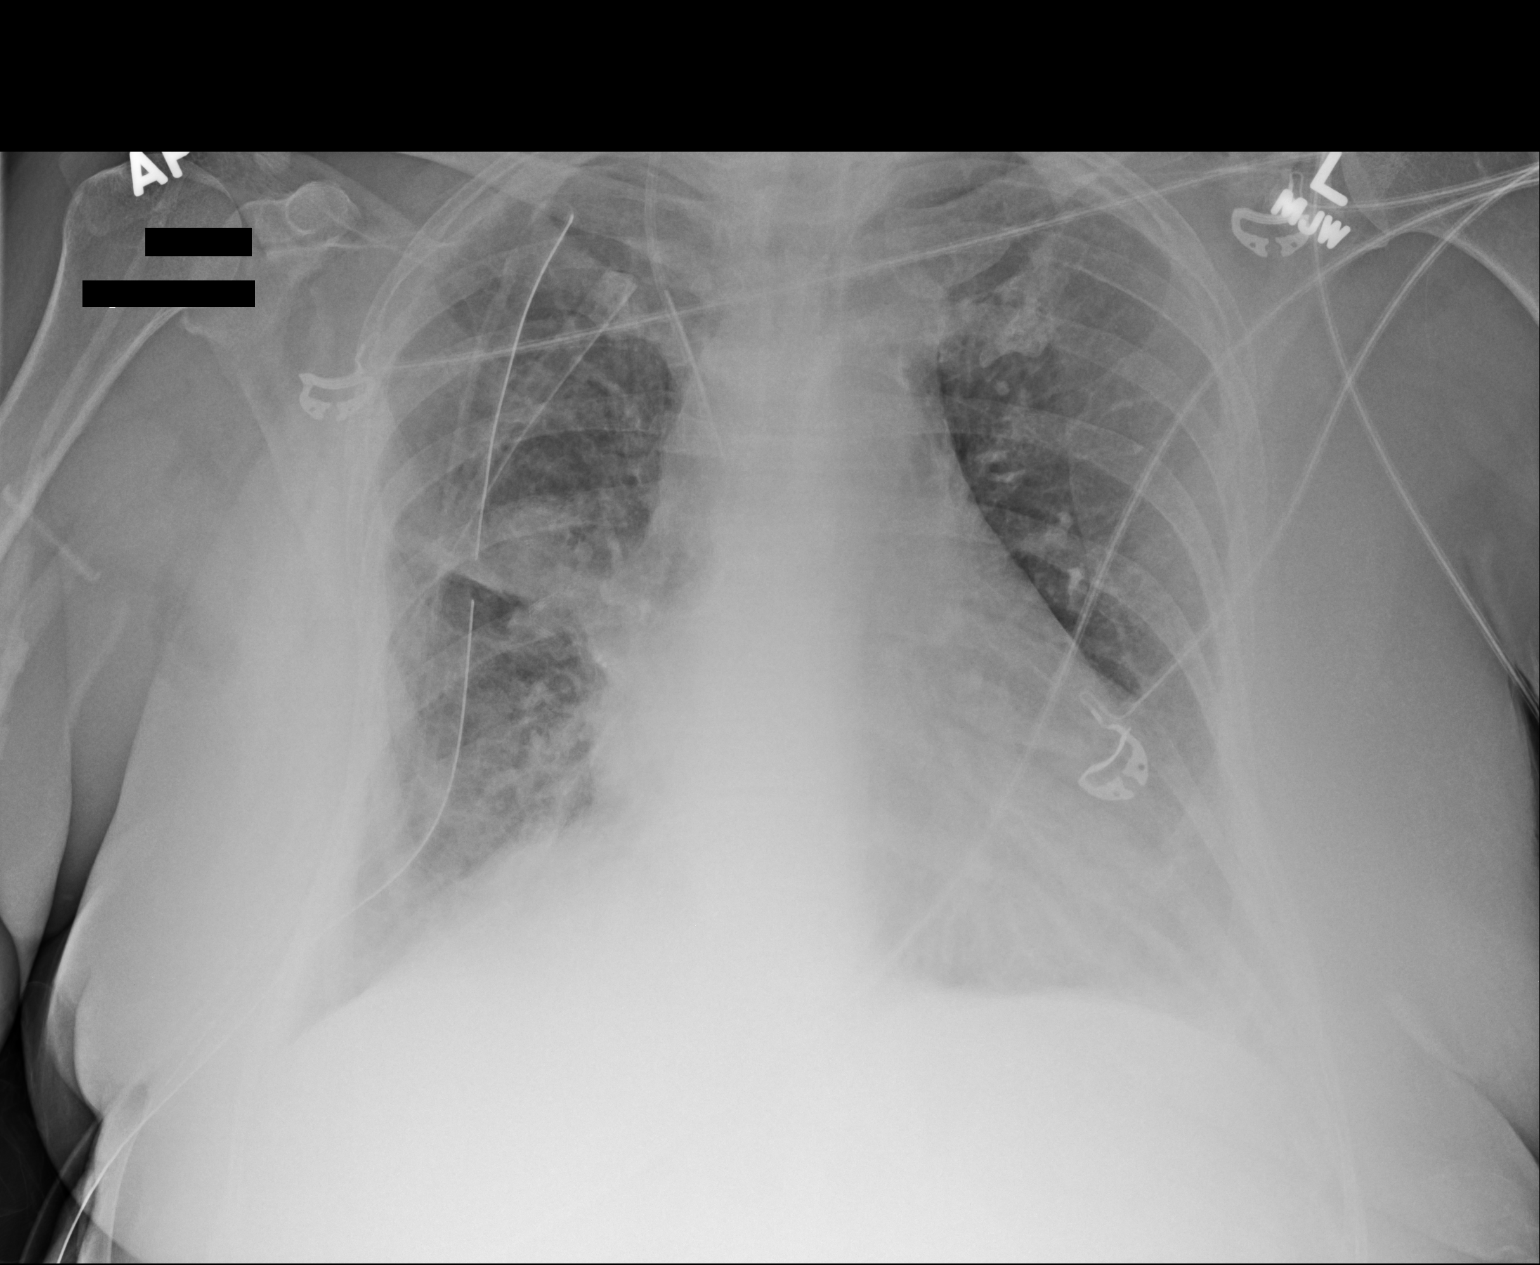

[1 of 1 positions shown; findings below may reference images not displayed]

FINDINGS: Trace right apical pneumothorax with two right-sided
thoracostomy tubes in place.  Right IJ central venous catheter in
stable position with the tip in the mid SVC.  Slightly increased
interstitial edema. Surgical changes of right middle lobectomy with
linear atelectasis versus postoperative change in the right mid
lung.  Stable enlargement the cardiopericardial silhouette.
Persistent bibasilar atelectasis versus infiltrate.
IMPRESSION: 1.  Small right apical pneumothorax with two right-sided
thoracostomy tubes in place.
2.  Slightly increased interstitial pulmonary edema.
3.  Persistent bibasilar and right mid lung atelectasis versus
postoperative change status post right middle lobectomy.

## 2015-01-12 IMAGING — CR DG CHEST 1V PORT SAME DAY
1 series · 1 of 1 positions shown · non-contrast
Comparison: Chest x-ray 11/04/2012.

CLINICAL DATA: Chest tube removal.

PORTABLE CHEST - 1 VIEW SAME DAY

[AP]
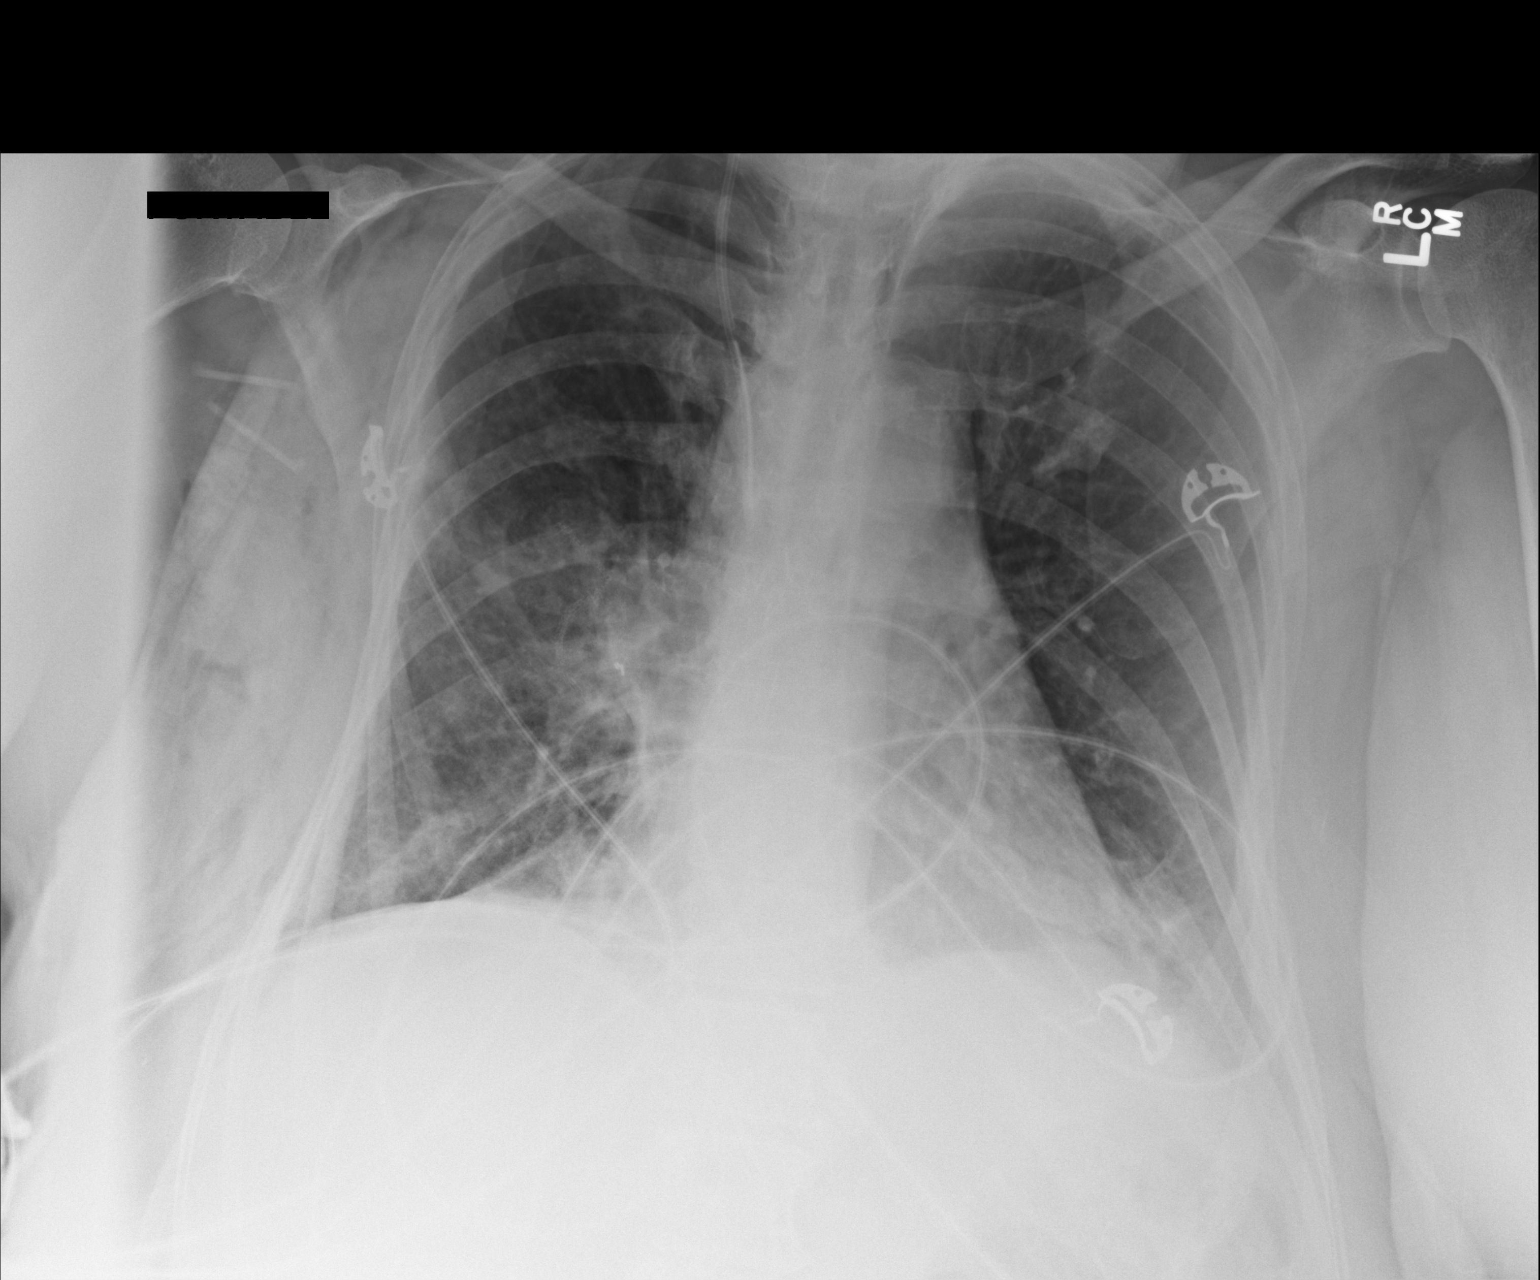

[1 of 1 positions shown; findings below may reference images not displayed]

FINDINGS: Previously noted right-sided chest tube has been removed.
No definite right apical pneumothorax is identified. There is a
right-sided internal jugular central venous catheter with tip
terminating in the mid superior vena cava. Lung volumes are
slightly low.  Patchy multifocal interstitial and airspace
opacities throughout the right mid to lower lung in the left base,
similar to the prior study, which may reflect areas of atelectasis
and/or consolidation.  Small left pleural effusion is unchanged.
No evidence of pulmonary edema.  Heart size is normal.  Mediastinal
contours are distorted by patient rotation to the left.
Subcutaneous emphysema throughout the right chest wall again noted.
IMPRESSION: 1.  Status post removal of right-sided chest tube.  No
pneumothorax.
2.  The radiographic appearance of the chest is otherwise
essentially unchanged, as above.

## 2015-11-18 ENCOUNTER — Other Ambulatory Visit: Payer: Self-pay | Admitting: *Deleted

## 2015-11-18 DIAGNOSIS — C342 Malignant neoplasm of middle lobe, bronchus or lung: Secondary | ICD-10-CM

## 2015-12-23 ENCOUNTER — Encounter: Payer: 59 | Admitting: Thoracic Surgery (Cardiothoracic Vascular Surgery)

## 2015-12-23 ENCOUNTER — Other Ambulatory Visit: Payer: Medicare Other

## 2016-01-20 ENCOUNTER — Encounter: Payer: 59 | Admitting: Thoracic Surgery (Cardiothoracic Vascular Surgery)

## 2016-01-20 ENCOUNTER — Other Ambulatory Visit: Payer: 59

## 2016-02-03 ENCOUNTER — Ambulatory Visit (INDEPENDENT_AMBULATORY_CARE_PROVIDER_SITE_OTHER): Payer: 59 | Admitting: Thoracic Surgery (Cardiothoracic Vascular Surgery)

## 2016-02-03 ENCOUNTER — Ambulatory Visit
Admission: RE | Admit: 2016-02-03 | Discharge: 2016-02-03 | Disposition: A | Discharge status: Home / Self Care | Payer: 59 | Source: Ambulatory Visit | Attending: Thoracic Surgery (Cardiothoracic Vascular Surgery) | Admitting: Thoracic Surgery (Cardiothoracic Vascular Surgery)

## 2016-02-03 ENCOUNTER — Encounter: Payer: Self-pay | Admitting: Thoracic Surgery (Cardiothoracic Vascular Surgery)

## 2016-02-03 VITALS — BP 129/67 | HR 79 | Resp 16 | Ht 63.0 in | Wt 170.6 lb

## 2016-02-03 DIAGNOSIS — D3A090 Benign carcinoid tumor of the bronchus and lung: Secondary | ICD-10-CM

## 2016-02-03 DIAGNOSIS — Z902 Acquired absence of lung [part of]: Secondary | ICD-10-CM | POA: Diagnosis not present

## 2016-02-03 DIAGNOSIS — C342 Malignant neoplasm of middle lobe, bronchus or lung: Secondary | ICD-10-CM

## 2016-02-03 NOTE — Progress Notes (Signed)
[address]       [place] [address]             [phone]    HPI: Mrs. Nitsch returns for a scheduled follow-up visit  PET/CT M[place] is a 75 year old woman who had a thoracoscopic right middle lobectomy for stage II a carcinoid tumor in September 2014. I last saw her in the office in October 2016. She had no evidence of recurrent disease.  She says she's been doing well. She has not had any issues with her breathing. She's not had any unusual headaches or visual changes. Her appetite is good and her weight is stable.   Current Outpatient Prescriptions  Medication Sig Dispense Refill  . alendronate (FOSAMAX) 70 MG tablet Take 70 mg by mouth every 7 (seven) days.     Marland Kitchen aspirin 81 MG tablet Take 81 mg by mouth daily.    Marland Kitchen atorvastatin (LIPITOR) 40 MG tablet Take 40 mg by mouth at bedtime.     . Cholecalciferol (VITAMIN D PO) Take 1 tablet by mouth at bedtime.    . furosemide (LASIX) 40 MG tablet Take 60 mg by mouth daily. TAKES 1 AND 1/2 TABS DAILY    . insulin NPH-regular (NOVOLIN 70/30) (70-30) 100 UNIT/ML injection Inject 22-47 Units into the skin 2 (two) times daily with a meal. SLIDING SCALE    . latanoprost (XALATAN) 0.005 % ophthalmic solution Place 1 drop into both eyes at bedtime.    Marland Kitchen ULORIC 40 MG tablet Take 40 mg by mouth daily.     . vitamin B-12 (CYANOCOBALAMIN) 1000 MCG tablet Take 1,000 mcg by mouth daily.     No current facility-administered medications for this visit.     Physical Exam BP 129/67 (BP Location: Right Arm, Patient Position: Sitting, Cuff Size: Large)   Pulse 79   Resp 16   Ht '5\' 3"'$  (1.6 m)   Wt 170 lb 9.6 oz (77.4 kg)   SpO2 96% Comment: ON RA  BMI 30.3 kg/m  75 year old woman in no acute distress Alert and oriented 3 with no focal motor deficits No cervical or supraclavicular adenopathy Lungs clear with equal breath sounds bilaterally Cardiac regular rate and rhythm normal S1 and S2, 2/6 systolic  murmur  Diagnostic Tests: CT CHEST WITHOUT CONTRAST  TECHNIQUE: Multidetector CT imaging of the chest was performed following the standard protocol without IV contrast.  COMPARISON:  12/24/2014  FINDINGS: Cardiovascular: No substantial change bilateral thyroid nodules heart size upper normal. Coronary artery calcification is noted. Atherosclerotic calcification is noted in the wall of the thoracic aorta.  Mediastinum/Nodes: No mediastinal lymphadenopathy. No evidence for gross hilar lymphadenopathy although assessment is limited by the lack of intravenous contrast on today's study. The esophagus has normal imaging features. Prevascular lymph node (image 37) unchanged in the interval.  Lungs/Pleura: Postsurgical change again noted right hemi thorax compatible with reported history of right middle lobectomy. 4 mm right upper lobe nodule measured previously is stable at 4 mm. Other scattered tiny bilateral pulmonary nodules appear similar in size although are slightly more conspicuous today, likely due to the thinner slice collimation. Surgical scar is become somewhat more confluent peripherally (compare image 58 series 3 today to image 33 series 4 previously, but otherwise stable appearance of the surgical scar. Stable trace right pleural effusion. No new pulmonary nodule or mass.  Upper Abdomen: Stable.  Musculoskeletal: Advanced degenerative changes are seen at the L1-2 interspace. Interval development of nonacute fracture involving  the right scapular spine with some minimal bridging callus evident.  IMPRESSION: 1. No substantial interval change in exam. Peripheral scar is slightly more confluent today and attention on follow-up recommended. 2. Nonacute fracture of the right scapula is new since the prior study. 3. Stable appearance multi nodular thyroid appearance.   Electronically Signed   By: Misty Stanley M.D.   On: 02/03/2016 13:58 I personally  reviewed the CT chest and concur with the findings noted above.  Impression: 75 year old woman who had a stage IIA carcinoid tumor resected with a right middle lobectomy 3 years ago. She has no evidence of recurrent disease at this time. I will continue with annual CT out to 5 years.  Plan: Return in one year with CT chest  [place] Nakayama, MD [facility] [phone]

## 2016-10-12 ENCOUNTER — Encounter: Payer: Self-pay | Admitting: Thoracic Surgery (Cardiothoracic Vascular Surgery)

## 2016-10-18 ENCOUNTER — Encounter: Payer: Self-pay | Admitting: *Deleted

## 2016-10-18 DIAGNOSIS — N186 End stage renal disease: Secondary | ICD-10-CM | POA: Insufficient documentation

## 2016-10-18 DIAGNOSIS — Z992 Dependence on renal dialysis: Secondary | ICD-10-CM

## 2016-10-18 DIAGNOSIS — E1140 Type 2 diabetes mellitus with diabetic neuropathy, unspecified: Secondary | ICD-10-CM | POA: Insufficient documentation

## 2016-10-18 DIAGNOSIS — D509 Iron deficiency anemia, unspecified: Secondary | ICD-10-CM | POA: Insufficient documentation

## 2016-10-19 ENCOUNTER — Encounter: Payer: Self-pay | Admitting: Thoracic Surgery (Cardiothoracic Vascular Surgery)

## 2016-10-21 ENCOUNTER — Encounter: Payer: Self-pay | Admitting: Thoracic Surgery (Cardiothoracic Vascular Surgery)

## 2017-02-08 ENCOUNTER — Ambulatory Visit: Payer: 59 | Admitting: Thoracic Surgery (Cardiothoracic Vascular Surgery)

## 2017-02-08 ENCOUNTER — Encounter: Payer: Self-pay | Admitting: Thoracic Surgery (Cardiothoracic Vascular Surgery)

## 2017-02-08 ENCOUNTER — Other Ambulatory Visit: Payer: Self-pay

## 2017-02-08 VITALS — BP 144/64 | HR 69 | Ht 63.0 in | Wt 150.0 lb

## 2017-02-08 DIAGNOSIS — D3A090 Benign carcinoid tumor of the bronchus and lung: Secondary | ICD-10-CM | POA: Diagnosis not present

## 2017-02-08 NOTE — Progress Notes (Signed)
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  I last saw her in the office in December 2017.  Her CT at that time showed no evidence of recurrent disease although there was some soft tissue thickening along the staple line from her previous surgery.  In the interim since her last visit she started dialysis in March.  She does that on Mondays Wednesdays and Fridays.  Last summer in July she started having right flank pain.  CT showed a 2 cm mass in the chest wall involving the 11th and 12th ribs on the right side.  A needle biopsy was done at [facility] which showed an atypical neuroendocrine tumor.  Continues to have significant right flank pain.  She has been waiting to see me for several months due to insurance issues.  Past Medical History:  Diagnosis Date  . Arthritis    osteo  . Cancer ([place]) 07/2013   vulvar cancer- resection and XRT  . 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 CKD (chronic kidney disease)    Cr 3.9 10/06/12 ([place])  . COPD (chronic obstructive pulmonary disease) ([place])   . Depression   . Diabetes mellitus without complication ([place])   . Dysrhythmia    heart beating fast (h[place]. monitor)  . Gout   . History of kidney stones   . Hyperlipidemia    mixed  . Hypertension    benign  . Insomnia   . Lung mass CT CHEST 10/17/2012   right middle lobe  . Lung nodules CT CHEST 10/17/2012   BOTH LUNGS  . PONV (postoperative nausea and vomiting)   . SOB (shortness of breath)   . Type II or unspecified type diabetes mellitus with neurological manifestations, not stated as uncontrolled(250.60)    arthropathy  . Vertigo     Current Outpatient Medications  Medication Sig Dispense Refill  . alendronate (FOSAMAX) 70 MG tablet Take 70 mg by  mouth every 7 (seven) days.     Marland Kitchen allopurinol (ZYLOPRIM) 100 MG tablet Take 100 mg by mouth daily.    Marland Kitchen aspirin 81 MG tablet Take 81 mg by mouth daily.    Marland Kitchen atorvastatin (LIPITOR) 40 MG tablet Take 40 mg by mouth at bedtime.     . Cholecalciferol (VITAMIN D PO) Take 1 tablet by mouth at bedtime.    . gabapentin (NEURONTIN) 100 MG capsule Take 100 mg by mouth 3 (three) times daily.    . insulin NPH-regular (NOVOLIN 70/30) (70-30) 100 UNIT/ML injection Inject 22-47 Units into the skin 2 (two) times daily with a meal. SLIDING SCALE    . latanoprost (XALATAN) 0.005 % ophthalmic solution Place 1 drop into both eyes at bedtime.     No current facility-administered medications for this visit.     Physical Exam BP (!) 144/64   Pulse 69   Ht 5\' 3"  (1.6 m)   Wt 150 lb (68 kg)   SpO2 98% Comment: on RA  BMI 26.57 kg/m  Elderly woman in no acute distress Alert and oriented x3 with no focal deficits no cervical or subclavicular adenopathy Lungs clear with equal breath sounds bilaterally Cardiac regular  rate and rhythm Tender to palpation right flank  Diagnostic Tests: PET TUMOR IMAGING WITH GA-68 DOTATATE,02/03/2017 4:47 PM   INDICATION: Neuroendocrine tumor of lung / Carcinoid COMPARISON: Multiple prior, most recent chest CT 12/30/2016.  TECHNIQUE: 73 minutes after the intravenous injection of 5.24 mCi GA-68 Dotatate, images were obtained from the skull base through the thighs. These images were attenuation corrected using CT.  [facility] and its affiliates are committed to minimizing radiation dose to patients while maintaining necessary diagnostic image quality. All CT scans are therefore performed using "As Low As Reasonably Achievable (ALARA)" protocols with either manual or automated exposure controls calibrated to the age and size of each patient.  LIMITATIONS: The low-dose CT acquisition was performed only for attenuation correction/activity localization. There  is no intravenous contrast, further limiting the CT component of the study. This modality has limited utility for detection or characterization of small lung nodules. Evaluation of the vasculature is limited by lack of IV contrast.  FINDINGS:  HEAD and NECK: Normal uptake is seen in the pituitary gland. No abnormal uptake is seen. Ancillary head and neck CT findings: Bilateral hypoattenuating and partially calcified thyroid nodules, the largest of which is in the left lobe and measures up to 2.0 cm in diameter.  CHEST: Similar appearance of the right pleural thickening and multifocal nodularity when compared to most recent prior CT 12/30/2016. The largest of these nodules is along the posterior costophrenic sulcus (series 3, image 133) adjacent to the right 12th rib and measures approximately 2.6 x 3.2 cm in axial dimension. Similar appearance of several mildly hypermetabolic lymph nodes in the right pericardial and epicardial fat. The largest of these measures approximately 1.4 x 1.1 cm (series 3, image 27). Similar appearance of a mildly hypermetabolic lymph node in the superior right mediastinum anteriorly (series 3, image 78). A 1.3 x 0.9 cm mildly hypermetabolic nodule seen along the medial pleura of the left lung apex (series 3, image 57). No abnormal uptake is seen in the heart, esophagus, hila, axilla, or breasts. Ancillary chest CT findings: Postsurgical changes of right middle lobectomy. Moderate coronary artery calcification. Aortic valve and thoracic aortic calcification. Tunneled right central venous catheter in place with tip terminating at the superior cavoatrial junction  ABDOMEN/PELVIS: Normal physiologic uptake is seen in the uncinate process of the pancreas, spleen and kidneys. Ancillary abdomen and pelvis CT findings: Heavy aortoiliac calcific atherosclerosis without aneurysm. Similar bilateral perinephric stranding. Similar left lower pole renal cyst. Cholecystectomy.  Hysterectomy.  MUSCULOSKELETAL: No abnormal uptake is seen in the soft tissues or bones. Ancillary musculoskeletal CT findings: Polyarticular degenerative changes. Post surgical changes within the left upper arm.  Other Result Information  Interface, Rad Results In - 02/04/2017  7:41 AM EST PET TUMOR IMAGING WITH GA-68 DOTATATE,02/03/2017 4:47 PM   INDICATION: Neuroendocrine tumor of lung / Carcinoid COMPARISON: Multiple prior, most recent chest CT 12/30/2016.  TECHNIQUE: 73 minutes after the intravenous injection of 5.24 mCi GA-68 Dotatate, images were obtained from the skull base through the thighs. These images were attenuation corrected using CT.  [facility] and its affiliates are committed to minimizing radiation dose to patients while maintaining necessary diagnostic image quality. All CT scans are therefore performed using "As Low As Reasonably Achievable (ALARA)" protocols with either manual or automated exposure controls calibrated to the age and size of each patient.  LIMITATIONS: The low-dose CT acquisition was performed only for attenuation correction/activity localization. There is no intravenous contrast, further limiting  the CT component of the study. This modality has limited utility for detection or characterization of small lung nodules. Evaluation of the vasculature is limited by lack of IV contrast.  FINDINGS:  HEAD and NECK: Normal uptake is seen in the pituitary gland. No abnormal uptake is seen. Ancillary head and neck CT findings: Bilateral hypoattenuating and partially calcified thyroid nodules, the largest of which is in the left lobe and measures up to 2.0 cm in diameter.  CHEST: Similar appearance of the right pleural thickening and multifocal nodularity when compared to most recent prior CT 12/30/2016. The largest of these nodules is along the posterior costophrenic sulcus (series 3, image 133) adjacent to the right 12th rib and measures  approximately 2.6 x 3.2 cm in axial dimension. Similar appearance of several mildly hypermetabolic lymph nodes in the right pericardial and epicardial fat. The largest of these measures approximately 1.4 x 1.1 cm (series 3, image 27). Similar appearance of a mildly hypermetabolic lymph node in the superior right mediastinum anteriorly (series 3, image 78). A 1.3 x 0.9 cm mildly hypermetabolic nodule seen along the medial pleura of the left lung apex (series 3, image 57). No abnormal uptake is seen in the heart, esophagus, hila, axilla, or breasts. Ancillary chest CT findings: Postsurgical changes of right middle lobectomy. Moderate coronary artery calcification. Aortic valve and thoracic aortic calcification. Tunneled right central venous catheter in place with tip terminating at the superior cavoatrial junction  ABDOMEN/PELVIS: Normal physiologic uptake is seen in the uncinate process of the pancreas, spleen and kidneys. Ancillary abdomen and pelvis CT findings: Heavy aortoiliac calcific atherosclerosis without aneurysm. Similar bilateral perinephric stranding. Similar left lower pole renal cyst. Cholecystectomy. Hysterectomy.  MUSCULOSKELETAL: No abnormal uptake is seen in the soft tissues or bones. Ancillary musculoskeletal CT findings: Polyarticular degenerative changes. Post surgical changes within the left upper arm.  CONCLUSION:  Findings compatible with metastatic disease including the right pleural, along the medial pleura of the left lung apex, right pericardial and epicardial lymph nodes, and a lymph node of the right anterior mediastinum. A nuclear medicine consult should be considered to assess for potential Lutathera treatment.  Bilateral partially calcified thyroid nodules. A thyroid ultrasound should be considered if these have not been previously evaluated.   I personally reviewed the images from her CT and her Dotatate scan and concur with the findings noted  above.  Impression: Mindy Martinez is a 76 year old woman who had a right middle lobectomy and node sampling for a stage IIA carcinoid tumor back in 2014.  Pathology at that time was consistent with a low-grade neuroendocrine tumor.  She has been followed since then.  Last summer she developed right back and flank pain.  CT imaging showed a mass involving the 11th and 12th ribs as well as progression of thickening along the previous staple line and multiple lymph nodes that were relatively stable in size.  A CT-guided biopsy of the chest wall mass showed metastatic neuroendocrine tumor.  This was felt to be consistent with an atypical carcinoid.  I do not think there is any role for surgical resection in the setting.  She has widespread involvement.  I think she can get the equivalent or better palliation with radiation to the chest wall mass.  She then needs some form of systemic treatment.  Plan: Follow up with Dr. Chancy Milroy  I will be happy to help with her care in any way that I can be of assistance  Melrose Nakayama, MD [facility] [phone]

## 2018-05-31 DEATH — deceased
